# Patient Record
Sex: Female | Born: 1955 | Race: White | Hispanic: No | Smoking: Never smoker
Health system: Southern US, Community
[De-identification: ages and names within clinical notes are randomized; demographics above are authoritative.]

## PROBLEM LIST (undated history)

## (undated) DIAGNOSIS — N189 Chronic kidney disease, unspecified: Secondary | ICD-10-CM

## (undated) DIAGNOSIS — I10 Essential (primary) hypertension: Secondary | ICD-10-CM

## (undated) DIAGNOSIS — F32A Depression, unspecified: Secondary | ICD-10-CM

## (undated) DIAGNOSIS — E785 Hyperlipidemia, unspecified: Secondary | ICD-10-CM

## (undated) HISTORY — PX: AUGMENTATION MAMMAPLASTY: SUR837

## (undated) HISTORY — DX: Hyperlipidemia, unspecified: E78.5

## (undated) HISTORY — DX: Chronic kidney disease, unspecified: N18.9

## (undated) HISTORY — DX: Depression, unspecified: F32.A

## (undated) HISTORY — PX: OTHER SURGICAL HISTORY: SHX169

---

## 1999-01-10 HISTORY — PX: BREAST SURGERY: SHX581

## 1999-09-09 ENCOUNTER — Encounter: Admission: RE | Admit: 1999-09-09 | Discharge: 1999-09-09 | Payer: Self-pay | Admitting: Obstetrics & Gynecology

## 1999-09-09 ENCOUNTER — Encounter: Payer: Self-pay | Admitting: Obstetrics & Gynecology

## 1999-09-19 ENCOUNTER — Encounter: Admission: RE | Admit: 1999-09-19 | Discharge: 1999-09-19 | Payer: Self-pay | Admitting: Obstetrics & Gynecology

## 1999-09-19 ENCOUNTER — Encounter: Payer: Self-pay | Admitting: Obstetrics & Gynecology

## 1999-10-17 ENCOUNTER — Encounter: Payer: Self-pay | Admitting: Obstetrics & Gynecology

## 1999-10-17 ENCOUNTER — Ambulatory Visit (HOSPITAL_COMMUNITY): Admission: RE | Admit: 1999-10-17 | Discharge: 1999-10-17 | Payer: Self-pay | Admitting: Obstetrics & Gynecology

## 2000-06-05 ENCOUNTER — Other Ambulatory Visit: Admission: RE | Admit: 2000-06-05 | Discharge: 2000-06-05 | Payer: Self-pay | Admitting: Obstetrics & Gynecology

## 2001-06-10 ENCOUNTER — Other Ambulatory Visit: Admission: RE | Admit: 2001-06-10 | Discharge: 2001-06-10 | Payer: Self-pay | Admitting: Family Medicine

## 2002-07-17 ENCOUNTER — Other Ambulatory Visit: Admission: RE | Admit: 2002-07-17 | Discharge: 2002-07-17 | Payer: Self-pay | Admitting: Family Medicine

## 2003-07-07 ENCOUNTER — Other Ambulatory Visit: Admission: RE | Admit: 2003-07-07 | Discharge: 2003-07-07 | Payer: Self-pay | Admitting: Family Medicine

## 2004-07-19 ENCOUNTER — Other Ambulatory Visit: Admission: RE | Admit: 2004-07-19 | Discharge: 2004-07-19 | Payer: Self-pay | Admitting: Family Medicine

## 2004-07-19 ENCOUNTER — Ambulatory Visit: Payer: Self-pay | Admitting: Family Medicine

## 2015-05-25 DIAGNOSIS — I10 Essential (primary) hypertension: Secondary | ICD-10-CM | POA: Insufficient documentation

## 2015-08-20 DIAGNOSIS — Z720 Tobacco use: Secondary | ICD-10-CM | POA: Insufficient documentation

## 2016-02-11 ENCOUNTER — Emergency Department (HOSPITAL_COMMUNITY)
Admission: EM | Admit: 2016-02-11 | Discharge: 2016-02-11 | Disposition: A | Discharge status: Home / Self Care | Payer: Self-pay | Attending: Emergency Medicine | Admitting: Emergency Medicine

## 2016-02-11 ENCOUNTER — Encounter (HOSPITAL_COMMUNITY): Payer: Self-pay

## 2016-02-11 ENCOUNTER — Emergency Department (HOSPITAL_COMMUNITY): Payer: Self-pay

## 2016-02-11 DIAGNOSIS — S43111A Subluxation of right acromioclavicular joint, initial encounter: Secondary | ICD-10-CM | POA: Insufficient documentation

## 2016-02-11 DIAGNOSIS — I10 Essential (primary) hypertension: Secondary | ICD-10-CM | POA: Insufficient documentation

## 2016-02-11 DIAGNOSIS — W010XXA Fall on same level from slipping, tripping and stumbling without subsequent striking against object, initial encounter: Secondary | ICD-10-CM | POA: Insufficient documentation

## 2016-02-11 DIAGNOSIS — Y9389 Activity, other specified: Secondary | ICD-10-CM | POA: Insufficient documentation

## 2016-02-11 DIAGNOSIS — Y929 Unspecified place or not applicable: Secondary | ICD-10-CM | POA: Insufficient documentation

## 2016-02-11 DIAGNOSIS — S43101A Unspecified dislocation of right acromioclavicular joint, initial encounter: Secondary | ICD-10-CM

## 2016-02-11 DIAGNOSIS — Y999 Unspecified external cause status: Secondary | ICD-10-CM | POA: Insufficient documentation

## 2016-02-11 HISTORY — DX: Essential (primary) hypertension: I10

## 2016-02-11 MED ORDER — HYDROCODONE-ACETAMINOPHEN 5-325 MG PO TABS
1.0000 | ORAL_TABLET | ORAL | Status: AC
  Administered 2016-02-11: 1 via ORAL
  Filled 2016-02-11: qty 1

## 2016-02-11 MED ORDER — HYDROCODONE-ACETAMINOPHEN 5-325 MG PO TABS: 1.0000 | ORAL_TABLET | Freq: Four times a day (QID) | ORAL | 0 refills | Status: DC | PRN

## 2016-02-11 NOTE — ED Provider Notes (Signed)
[facility]-EMERGENCY DEPT Provider Note   CSN: [id number] Arrival date & time: 02/11/16  [id number]   By signing my name below, I, Bobbie Stackhristopher Reid, attest that this documentation has been prepared under the direction and in the presence of Linwood DibblesJon Keyoni Lapinski, MD. Electronically Signed: Bobbie Stackhristopher Reid, Scribe. 02/11/16. 9:59 AM. History   Chief Complaint Chief Complaint  Patient presents with  . Shoulder Pain    The history is provided by the patient and a relative. No language interpreter was used.    HPI Comments: Brandy Walker is a 61 y.o. female who presents to the Emergency Department complaining of right shoulder pain since 7:30 am this morning. Patient states that she was taking her dogs outside when she tripped and fell. She reports a laceration on her lip. She also believes she may have broken her clavicle. She reports having fractured her right humerus head in the past and had a pin placed at that time. She denies LOC, weakness, and numbness. It is noted that she is up to date with her tetanus shot.  Past Medical History:  Diagnosis Date  . Hypertension     There are no active problems to display for this patient.   Past Surgical History:  Procedure Laterality Date  . shoulder sx     Pins in humerus head  age 61    OB History    No data available       Home Medications    Prior to Admission medications   Medication Sig Start Date End Date Taking? Authorizing Provider  HYDROcodone-acetaminophen (NORCO/VICODIN) 5-325 MG tablet Take 1 tablet by mouth every 6 (six) hours as needed. 02/11/16   Linwood DibblesJon Favor Kreh, MD    Family History No family history on file.  Social History Social History  Substance Use Topics  . Smoking status: Never Smoker  . Smokeless tobacco: Never Used  . Alcohol use 1.8 oz/week    3 Shots of liquor per week     Allergies   Patient has no known allergies.   Review of Systems Review of Systems  All other systems reviewed and are negative.    Physical  Exam Updated Vital Signs BP (!) 132/110 (BP Location: Left Arm)   Pulse 97   Temp 97.7 F (36.5 C) (Oral)   Resp 18   Ht 5\' 3"  (1.6 m)   Wt 63.5 kg   SpO2 99%   BMI 24.80 kg/m   Physical Exam  Constitutional: She appears well-developed and well-nourished. No distress.  HENT:  Head: Normocephalic and atraumatic.  Right Ear: External ear normal.  Left Ear: External ear normal.  Proximally 3 mm laceration oral mucosa at the junction of the gingival and labial mucosa.  Eyes: Conjunctivae are normal. Right eye exhibits no discharge. Left eye exhibits no discharge. No scleral icterus.  Neck: Neck supple. No tracheal deviation present.  Cardiovascular: Normal rate.   Pulmonary/Chest: Effort normal. No stridor. No respiratory distress.  Abdominal: She exhibits no distension.  Musculoskeletal: She exhibits no edema.       Right shoulder: She exhibits decreased range of motion, tenderness, bony tenderness and deformity.  Well healed scar, right shoulder. Unable to lift arm at all.  Neurological: She is alert. Cranial nerve deficit: no gross deficits.  Skin: Skin is warm and dry. No rash noted.  Psychiatric: She has a normal mood and affect.  Nursing note and vitals reviewed.    ED Treatments / Results  DIAGNOSTIC STUDIES: Oxygen Saturation is 99% on RA, normal  by my interpretation.    COORDINATION OF CARE: 9:50 AM Discussed treatment plan with pt at bedside, which includes X-ray of shoulder, and pt agreed to plan.   Radiology Dg Clavicle Right  Result Date: 02/11/2016 CLINICAL DATA:  Tripped and fell this morning. Severe pain and limited range of motion. EXAM: RIGHT CLAVICLE - 2+ VIEWS COMPARISON:  None. FINDINGS: No clavicle fracture is seen. There is complete upward subluxation of the distal clavicle relative to the acromion, age indeterminate. IMPRESSION: Complete upper subluxation of the distal clavicle relative to the acromion. This is age indeterminate. This may be old due to  a relative lack of soft tissue swelling visible at radiography. Is this the area of pain? Electronically Signed   By: Paulina Fusi M.D.   On: 02/11/2016 10:26   Dg Shoulder Right  Result Date: 02/11/2016 CLINICAL DATA:  Tripped and fell.  Pain and limited range of motion. EXAM: RIGHT SHOULDER - 2+ VIEW COMPARISON:  None. FINDINGS: Humeral head is properly located relative to the glenoid. 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 Normal humeral acromial distance. Previous anchors in the glenoid region. Complete upward displacement of the distal clavicle relative to the acromion. PG is indeterminate, but favored to be old due to limited regional soft tissue swelling. Regional ribs appear normal. IMPRESSION: Complete upward subluxation of the distal clavicle relative to the acromion. This is age indeterminate but may be old based on relatively little regional soft tissue swelling. Is this the area of pain? Electronically Signed   By: Paulina Fusi M.D.   On: 02/11/2016 10:25    Procedures Procedures (including critical care time)  Medications Ordered in ED Medications  HYDROcodone-acetaminophen (NORCO/VICODIN) 5-325 MG per tablet 1 tablet (1 tablet Oral Given 02/11/16 0954)     Initial Impression / Assessment and Plan / ED Course  I have reviewed the triage vital signs and the nursing notes.  Pertinent labs & imaging results that were available during my care of the patient were reviewed by me and considered in my medical decision making (see chart for details).   Xrays show a severe ac separation.  No fx or dislocation.  Will place in a sling.  Follow up with orthopedics for further treatment.  Final Clinical Impressions(s) / ED Diagnoses   Final diagnoses:  Separation of right acromioclavicular joint, type 3, initial encounter    New Prescriptions New Prescriptions   HYDROCODONE-ACETAMINOPHEN (NORCO/VICODIN) 5-325 MG TABLET    Take 1 tablet by mouth every 6 (six) hours as needed.   I personally  performed the services described in this documentation, which was scribed in my presence.  The recorded information has been reviewed and is accurate.    Linwood Dibbles, MD 02/11/16 1058

## 2016-02-11 NOTE — Discharge Instructions (Signed)
Use the sling to help support your shoulder, contact an orthopedic doctor to arrange for follow up.  Dr Audrie Liauda's group is in [place].  Dr Hilda LiasKeeling and Dr Romeo AppleHarrison [place]

## 2016-02-11 NOTE — ED Triage Notes (Signed)
Pt fell this morning approximately 0730. Tripped while taking dog out. Complaining of pain right clavicular area. Difficulty moving arm . Also has laceration in lower gum

## 2017-03-31 ENCOUNTER — Emergency Department (HOSPITAL_COMMUNITY)
Admission: EM | Admit: 2017-03-31 | Discharge: 2017-04-01 | Disposition: A | Discharge status: Home / Self Care | Payer: Self-pay | Attending: Emergency Medicine | Admitting: Emergency Medicine

## 2017-03-31 ENCOUNTER — Emergency Department (HOSPITAL_COMMUNITY): Payer: Self-pay

## 2017-03-31 ENCOUNTER — Encounter (HOSPITAL_COMMUNITY): Payer: Self-pay

## 2017-03-31 DIAGNOSIS — S0083XA Contusion of other part of head, initial encounter: Secondary | ICD-10-CM | POA: Insufficient documentation

## 2017-03-31 DIAGNOSIS — I10 Essential (primary) hypertension: Secondary | ICD-10-CM | POA: Insufficient documentation

## 2017-03-31 DIAGNOSIS — Z23 Encounter for immunization: Secondary | ICD-10-CM | POA: Insufficient documentation

## 2017-03-31 DIAGNOSIS — Y9389 Activity, other specified: Secondary | ICD-10-CM | POA: Insufficient documentation

## 2017-03-31 DIAGNOSIS — R4182 Altered mental status, unspecified: Secondary | ICD-10-CM | POA: Insufficient documentation

## 2017-03-31 DIAGNOSIS — Y92481 Parking lot as the place of occurrence of the external cause: Secondary | ICD-10-CM | POA: Insufficient documentation

## 2017-03-31 DIAGNOSIS — W228XXA Striking against or struck by other objects, initial encounter: Secondary | ICD-10-CM | POA: Insufficient documentation

## 2017-03-31 DIAGNOSIS — S0181XA Laceration without foreign body of other part of head, initial encounter: Secondary | ICD-10-CM

## 2017-03-31 DIAGNOSIS — F10920 Alcohol use, unspecified with intoxication, uncomplicated: Secondary | ICD-10-CM | POA: Insufficient documentation

## 2017-03-31 DIAGNOSIS — R0789 Other chest pain: Secondary | ICD-10-CM | POA: Insufficient documentation

## 2017-03-31 DIAGNOSIS — Y999 Unspecified external cause status: Secondary | ICD-10-CM | POA: Insufficient documentation

## 2017-03-31 LAB — COMPREHENSIVE METABOLIC PANEL
ALT: 79 U/L — ABNORMAL HIGH (ref 14–54)
ANION GAP: 12 (ref 5–15)
AST: 95 U/L — AB (ref 15–41)
Albumin: 4 g/dL (ref 3.5–5.0)
Alkaline Phosphatase: 75 U/L (ref 38–126)
BUN: 15 mg/dL (ref 6–20)
CHLORIDE: 107 mmol/L (ref 101–111)
CO2: 21 mmol/L — ABNORMAL LOW (ref 22–32)
Calcium: 9 mg/dL (ref 8.9–10.3)
Creatinine, Ser: 0.98 mg/dL (ref 0.44–1.00)
GFR calc Af Amer: >60 mL/min (ref >60)
Glucose, Bld: 97 mg/dL (ref 65–99)
POTASSIUM: 4.2 mmol/L (ref 3.5–5.1)
Sodium: 140 mmol/L (ref 135–145)
Total Bilirubin: 0.6 mg/dL (ref 0.3–1.2)
Total Protein: 7.1 g/dL (ref 6.5–8.1)

## 2017-03-31 LAB — CBC
HEMATOCRIT: 39.7 % (ref 36.0–46.0)
HEMOGLOBIN: 13.2 g/dL (ref 12.0–15.0)
MCH: 32.4 pg (ref 26.0–34.0)
MCHC: 33.2 g/dL (ref 30.0–36.0)
MCV: 97.3 fL (ref 78.0–100.0)
Platelets: 238 10*3/uL (ref 150–400)
RBC: 4.08 MIL/uL (ref 3.87–5.11)
RDW: 12.7 % (ref 11.5–15.5)
WBC: 10.4 10*3/uL (ref 4.0–10.5)

## 2017-03-31 LAB — CK: CK TOTAL: 100 U/L (ref 38–234)

## 2017-03-31 LAB — I-STAT CG4 LACTIC ACID, ED: Lactic Acid, Venous: 3.7 mmol/L (ref 0.5–1.9)

## 2017-03-31 LAB — ETHANOL: ALCOHOL ETHYL (B): 296 mg/dL — AB (ref <10)

## 2017-03-31 MED ORDER — MIDAZOLAM HCL 2 MG/2ML IJ SOLN
5.0000 mg | Freq: Once | INTRAMUSCULAR | Status: AC
  Administered 2017-03-31: 5 mg via INTRAMUSCULAR
  Filled 2017-03-31: qty 6

## 2017-03-31 MED ORDER — TETANUS-DIPHTH-ACELL PERTUSSIS 5-2.5-18.5 LF-MCG/0.5 IM SUSP
0.5000 mL | Freq: Once | INTRAMUSCULAR | Status: AC
  Administered 2017-03-31: 0.5 mL via INTRAMUSCULAR
  Filled 2017-03-31: qty 0.5

## 2017-03-31 MED ORDER — FENTANYL CITRATE (PF) 100 MCG/2ML IJ SOLN
50.0000 ug | Freq: Once | INTRAMUSCULAR | Status: AC
  Administered 2017-03-31: 50 ug via INTRAVENOUS
  Filled 2017-03-31: qty 2

## 2017-03-31 MED ORDER — SODIUM CHLORIDE 0.9 % IV BOLUS (SEPSIS)
1000.0000 mL | Freq: Once | INTRAVENOUS | Status: AC
  Administered 2017-03-31: 1000 mL via INTRAVENOUS

## 2017-03-31 MED ORDER — LIDOCAINE-EPINEPHRINE (PF) 2 %-1:200000 IJ SOLN
10.0000 mL | Freq: Once | INTRAMUSCULAR | Status: AC
  Administered 2017-03-31: 10 mL
  Filled 2017-03-31: qty 20

## 2017-03-31 MED ORDER — LIDOCAINE-EPINEPHRINE-TETRACAINE (LET) SOLUTION
3.0000 mL | Freq: Once | NASAL | Status: AC
  Administered 2017-03-31: 3 mL via TOPICAL
  Filled 2017-03-31: qty 3

## 2017-03-31 MED ORDER — LIDOCAINE-EPINEPHRINE 1 %-1:100000 IJ SOLN: 10.0000 mL | Freq: Once | INTRAMUSCULAR | Status: DC

## 2017-03-31 NOTE — ED Notes (Signed)
Patient transported to CT 
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
## 2017-03-31 NOTE — ED Notes (Signed)
Pt swearing at hospital staff states she will be suing everyone and that she is refusing medication.  Explained to pt she no longer has the right to refuse medication d/t paperwork Dr. Clarene DukeLittle took out.  Pt insisting "M.F. Handcuffs" be taken off explained to pt the handcuffs cannot come off until she is calmed down and not a danger to herself or others.  Explained to pt that we need to be able to properly assess her injuries.
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
## 2017-03-31 NOTE — ED Notes (Signed)
Writer notified EDP of abnormal I-stat lactic result 

## 2017-04-01 LAB — RAPID URINE DRUG SCREEN, HOSP PERFORMED
Amphetamines: NOT DETECTED
BENZODIAZEPINES: POSITIVE — AB
Barbiturates: NOT DETECTED
Cocaine: NOT DETECTED
OPIATES: NOT DETECTED
Tetrahydrocannabinol: NOT DETECTED

## 2017-04-01 NOTE — Discharge Instructions (Addendum)
Your sutures are absorbable sutures and will not need to be removed. Use cold compresses to the sore areas. Follow up with your doctor as needed.

## 2017-04-01 NOTE — ED Notes (Signed)
Pt discharged from ED; instructions provided; Pt encouraged to return to ED if symptoms worsen and to f/u with PCP; Pt verbalized understanding of all instructions 

## 2017-05-14 ENCOUNTER — Other Ambulatory Visit: Payer: Self-pay | Admitting: Family Medicine

## 2017-05-14 DIAGNOSIS — F1021 Alcohol dependence, in remission: Secondary | ICD-10-CM | POA: Insufficient documentation

## 2017-05-14 DIAGNOSIS — Z1231 Encounter for screening mammogram for malignant neoplasm of breast: Secondary | ICD-10-CM

## 2017-05-14 DIAGNOSIS — F5101 Primary insomnia: Secondary | ICD-10-CM | POA: Insufficient documentation

## 2017-06-05 ENCOUNTER — Ambulatory Visit: Payer: Self-pay

## 2018-10-18 ENCOUNTER — Other Ambulatory Visit: Payer: Self-pay | Admitting: *Deleted

## 2018-10-18 DIAGNOSIS — Z20822 Contact with and (suspected) exposure to covid-19: Secondary | ICD-10-CM

## 2018-10-20 LAB — NOVEL CORONAVIRUS, NAA: SARS-CoV-2, NAA: NOT DETECTED

## 2019-11-24 ENCOUNTER — Encounter: Payer: Self-pay | Admitting: Family Medicine

## 2019-12-02 ENCOUNTER — Encounter: Payer: Self-pay | Admitting: Family Medicine

## 2019-12-02 ENCOUNTER — Ambulatory Visit: Payer: 59 | Admitting: Family Medicine

## 2019-12-02 ENCOUNTER — Other Ambulatory Visit: Payer: Self-pay

## 2019-12-02 VITALS — BP 147/90 | HR 98 | Temp 98.3°F | Ht 63.0 in | Wt 144.2 lb

## 2019-12-02 DIAGNOSIS — F1021 Alcohol dependence, in remission: Secondary | ICD-10-CM | POA: Diagnosis not present

## 2019-12-02 DIAGNOSIS — Z7689 Persons encountering health services in other specified circumstances: Secondary | ICD-10-CM

## 2019-12-02 DIAGNOSIS — I10 Essential (primary) hypertension: Secondary | ICD-10-CM | POA: Diagnosis not present

## 2019-12-02 DIAGNOSIS — Z1231 Encounter for screening mammogram for malignant neoplasm of breast: Secondary | ICD-10-CM | POA: Diagnosis not present

## 2019-12-02 DIAGNOSIS — F325 Major depressive disorder, single episode, in full remission: Secondary | ICD-10-CM

## 2019-12-02 MED ORDER — AMLODIPINE BESYLATE 10 MG PO TABS: 10.0000 mg | ORAL_TABLET | Freq: Every day | ORAL | 3 refills | Status: DC

## 2019-12-02 MED ORDER — VENLAFAXINE HCL ER 150 MG PO CP24: 150.0000 mg | ORAL_CAPSULE | Freq: Every day | ORAL | 3 refills | Status: DC

## 2019-12-02 NOTE — Patient Instructions (Signed)
DASH Eating Plan °DASH stands for "Dietary Approaches to Stop Hypertension." The DASH eating plan is a healthy eating plan that has been shown to reduce high blood pressure (hypertension). It may also reduce your risk for type 2 diabetes, heart disease, and stroke. The DASH eating plan may also help with weight loss. °What are tips for following this plan? ° °General guidelines °· Avoid eating more than 2,300 mg (milligrams) of salt (sodium) a day. If you have hypertension, you may need to reduce your sodium intake to 1,500 mg a day. °· Limit alcohol intake to no more than 1 drink a day for nonpregnant women and 2 drinks a day for men. One drink equals 12 oz of beer, 5 oz of wine, or 1½ oz of hard liquor. °· Work with your health care provider to maintain a healthy body weight or to lose weight. Ask what an ideal weight is for you. °· Get at least 30 minutes of exercise that causes your heart to beat faster (aerobic exercise) most days of the week. Activities may include walking, swimming, or biking. °· Work with your health care provider or diet and nutrition specialist (dietitian) to adjust your eating plan to your individual calorie needs. °Reading food labels ° °· Check food labels for the amount of sodium per serving. Choose foods with less than 5 percent of the Daily Value of sodium. Generally, foods with less than 300 mg of sodium per serving fit into this eating plan. °· To find whole grains, look for the word "whole" as the first word in the ingredient list. °Shopping °· Buy products labeled as "low-sodium" or "no salt added." °· Buy fresh foods. Avoid canned foods and premade or frozen meals. °Cooking °· Avoid adding salt when cooking. Use salt-free seasonings or herbs instead of table salt or sea salt. Check with your health care provider or pharmacist before using salt substitutes. °· Do not fry foods. Cook foods using healthy methods such as baking, boiling, grilling, and broiling instead. °· Cook with  heart-healthy oils, such as olive, canola, soybean, or sunflower oil. °Meal planning °· Eat a balanced diet that includes: °? 5 or more servings of fruits and vegetables each day. At each meal, try to fill half of your plate with fruits and vegetables. °? Up to 6-8 servings of whole grains each day. °? Less than 6 oz of lean meat, poultry, or fish each day. A 3-oz serving of meat is about the same size as a deck of cards. One egg equals 1 oz. °? 2 servings of low-fat dairy each day. °? A serving of nuts, seeds, or beans 5 times each week. °? Heart-healthy fats. Healthy fats called Omega-3 fatty acids are found in foods such as flaxseeds and coldwater fish, like sardines, salmon, and mackerel. °· Limit how much you eat of the following: °? Canned or prepackaged foods. °? Food that is high in trans fat, such as fried foods. °? Food that is high in saturated fat, such as fatty meat. °? Sweets, desserts, sugary drinks, and other foods with added sugar. °? Full-fat dairy products. °· Do not salt foods before eating. °· Try to eat at least 2 vegetarian meals each week. °· Eat more home-cooked food and less restaurant, buffet, and fast food. °· When eating at a restaurant, ask that your food be prepared with less salt or no salt, if possible. °What foods are recommended? °The items listed may not be a complete list. Talk with your dietitian about   what dietary choices are best for you. °Grains °Whole-grain or whole-wheat bread. Whole-grain or whole-wheat pasta. [place] rice. Oatmeal. Quinoa. Bulgur. Whole-grain and low-sodium cereals. Pita bread. Low-fat, low-sodium crackers. Whole-wheat flour tortillas. °Vegetables °Fresh or frozen vegetables (raw, steamed, roasted, or grilled). Low-sodium or reduced-sodium tomato and vegetable juice. Low-sodium or reduced-sodium tomato sauce and tomato paste. Low-sodium or reduced-sodium canned vegetables. °Fruits °All fresh, dried, or frozen fruit. Canned fruit in natural juice (without  added sugar). °Meat and other protein foods °Skinless chicken or turkey. Ground chicken or turkey. Pork with fat trimmed off. Fish and seafood. Egg whites. Dried beans, peas, or lentils. Unsalted nuts, nut butters, and seeds. Unsalted canned beans. Lean cuts of beef with fat trimmed off. Low-sodium, lean deli meat. °Dairy °Low-fat (1%) or fat-free (skim) milk. Fat-free, low-fat, or reduced-fat cheeses. Nonfat, low-sodium ricotta or cottage cheese. Low-fat or nonfat yogurt. Low-fat, low-sodium cheese. °Fats and oils °Soft margarine without trans fats. Vegetable oil. Low-fat, reduced-fat, or light mayonnaise and salad dressings (reduced-sodium). Canola, safflower, olive, soybean, and sunflower oils. Avocado. °Seasoning and other foods °Herbs. Spices. Seasoning mixes without salt. Unsalted popcorn and pretzels. Fat-free sweets. °What foods are not recommended? °The items listed may not be a complete list. Talk with your dietitian about what dietary choices are best for you. °Grains °Baked goods made with fat, such as croissants, muffins, or some breads. Dry pasta or rice meal packs. °Vegetables °Creamed or fried vegetables. Vegetables in a cheese sauce. Regular canned vegetables (not low-sodium or reduced-sodium). Regular canned tomato sauce and paste (not low-sodium or reduced-sodium). Regular tomato and vegetable juice (not low-sodium or reduced-sodium). Pickles. Olives. °Fruits °Canned fruit in a light or heavy syrup. Fried fruit. Fruit in cream or butter sauce. °Meat and other protein foods °Fatty cuts of meat. Ribs. Fried meat. Bacon. Sausage. Bologna and other processed lunch meats. Salami. Fatback. Hotdogs. Bratwurst. Salted nuts and seeds. Canned beans with added salt. Canned or smoked fish. Whole eggs or egg yolks. Chicken or turkey with skin. °Dairy °Whole or 2% milk, cream, and half-and-half. Whole or full-fat cream cheese. Whole-fat or sweetened yogurt. Full-fat cheese. Nondairy creamers. Whipped toppings.  Processed cheese and cheese spreads. °Fats and oils °Butter. Stick margarine. Lard. Shortening. Ghee. Bacon fat. Tropical oils, such as coconut, palm kernel, or palm oil. °Seasoning and other foods °Salted popcorn and pretzels. Onion salt, garlic salt, seasoned salt, table salt, and sea salt. Worcestershire sauce. Tartar sauce. Barbecue sauce. Teriyaki sauce. Soy sauce, including reduced-sodium. Steak sauce. Canned and packaged gravies. Fish sauce. Oyster sauce. Cocktail sauce. Horseradish that you find on the shelf. Ketchup. Mustard. Meat flavorings and tenderizers. Bouillon cubes. Hot sauce and Tabasco sauce. Premade or packaged marinades. Premade or packaged taco seasonings. Relishes. Regular salad dressings. °Where to find more information: °· National Heart, Lung, and Blood Institute: www.nhlbi.nih.gov °· American Heart Association: www.heart.org °Summary °· The DASH eating plan is a healthy eating plan that has been shown to reduce high blood pressure (hypertension). It may also reduce your risk for type 2 diabetes, heart disease, and stroke. °· With the DASH eating plan, you should limit salt (sodium) intake to 2,300 mg a day. If you have hypertension, you may need to reduce your sodium intake to 1,500 mg a day. °· When on the DASH eating plan, aim to eat more fresh fruits and vegetables, whole grains, lean proteins, low-fat dairy, and heart-healthy fats. °· Work with your health care provider or diet and nutrition specialist (dietitian) to adjust your eating plan to your   individual calorie needs. °This information is not intended to replace advice given to you by your health care provider. Make sure you discuss any questions you have with your health care provider. °Document Revised: 12/08/2016 Document Reviewed: 12/20/2015 °Elsevier Patient Education © 2020 [employer] °Hypertension, Adult °High blood pressure (hypertension) is when the force of blood pumping through the arteries is too strong. The  arteries are the blood vessels that carry blood from the heart throughout the body. Hypertension forces the heart to work harder to pump blood and may cause arteries to become narrow or stiff. Untreated or uncontrolled hypertension can cause a heart attack, heart failure, a stroke, kidney disease, and other problems. °A blood pressure reading consists of a higher number over a lower number. Ideally, your blood pressure should be below 120/80. The first ("top") number is called the systolic pressure. It is a measure of the pressure in your arteries as your heart beats. The second ("bottom") number is called the diastolic pressure. It is a measure of the pressure in your arteries as the heart relaxes. °What are the causes? °The exact cause of this condition is not known. There are some conditions that result in or are related to high blood pressure. °What increases the risk? °Some risk factors for high blood pressure are under your control. The following factors may make you more likely to develop this condition: °· Smoking. °· Having type 2 diabetes mellitus, high cholesterol, or both. °· Not getting enough exercise or physical activity. °· Being overweight. °· Having too much fat, sugar, calories, or salt (sodium) in your diet. °· Drinking too much alcohol. °Some risk factors for high blood pressure may be difficult or impossible to change. Some of these factors include: °· Having chronic kidney disease. °· Having a family history of high blood pressure. °· Age. Risk increases with age. °· Race. You may be at higher risk if you are African American. °· Gender. Men are at higher risk than women before age 45. After age 65, women are at higher risk than men. °· Having obstructive sleep apnea. °· Stress. °What are the signs or symptoms? °High blood pressure may not cause symptoms. Very high blood pressure (hypertensive crisis) may cause: °· Headache. °· Anxiety. °· Shortness of breath. °· Nosebleed. °· Nausea and  vomiting. °· Vision changes. °· Severe chest pain. °· Seizures. °How is this diagnosed? °This condition is diagnosed by measuring your blood pressure while you are seated, with your arm resting on a flat surface, your legs uncrossed, and your feet flat on the floor. The cuff of the blood pressure monitor will be placed directly against the skin of your upper arm at the level of your heart. It should be measured at least twice using the same arm. Certain conditions can cause a difference in blood pressure between your right and left arms. °Certain factors can cause blood pressure readings to be lower or higher than normal for a short period of time: °· When your blood pressure is higher when you are in a health care provider's office than when you are at home, this is called white coat hypertension. Most people with this condition do not need medicines. °· When your blood pressure is higher at home than when you are in a health care provider's office, this is called masked hypertension. Most people with this condition may need medicines to control blood pressure. °If you have a high blood pressure reading during one visit or you have normal blood pressure   with other risk factors, you may be asked to: °· Return on a different day to have your blood pressure checked again. °· Monitor your blood pressure at home for 1 week or longer. °If you are diagnosed with hypertension, you may have other blood or imaging tests to help your health care provider understand your overall risk for other conditions. °How is this treated? °This condition is treated by making healthy lifestyle changes, such as eating healthy foods, exercising more, and reducing your alcohol intake. Your health care provider may prescribe medicine if lifestyle changes are not enough to get your blood pressure under control, and if: °· Your systolic blood pressure is above 130. °· Your diastolic blood pressure is above 80. °Your personal target blood  pressure may vary depending on your medical conditions, your age, and other factors. °Follow these instructions at home: °Eating and drinking ° °· Eat a diet that is high in fiber and potassium, and low in sodium, added sugar, and fat. An example eating plan is called the DASH (Dietary Approaches to Stop Hypertension) diet. To eat this way: °? Eat plenty of fresh fruits and vegetables. Try to fill one half of your plate at each meal with fruits and vegetables. °? Eat whole grains, such as whole-wheat pasta, [place] rice, or whole-grain bread. Fill about one fourth of your plate with whole grains. °? Eat or drink low-fat dairy products, such as skim milk or low-fat yogurt. °? Avoid fatty cuts of meat, processed or cured meats, and poultry with skin. Fill about one fourth of your plate with lean proteins, such as fish, chicken without skin, beans, eggs, or tofu. °? Avoid pre-made and processed foods. These tend to be higher in sodium, added sugar, and fat. °· Reduce your daily sodium intake. Most people with hypertension should eat less than 1,500 mg of sodium a day. °· Do not drink alcohol if: °? Your health care provider tells you not to drink. °? You are pregnant, may be pregnant, or are planning to become pregnant. °· If you drink alcohol: °? Limit how much you use to: °§ 0-1 drink a day for women. °§ 0-2 drinks a day for men. °? Be aware of how much alcohol is in your drink. In the [place] one drink equals one 12 oz bottle of beer (355 mL), one 5 oz glass of wine (148 mL), or one 1½ oz glass of hard liquor (44 mL). °Lifestyle ° °· Work with your health care provider to maintain a healthy body weight or to lose weight. Ask what an ideal weight is for you. °· Get at least 30 minutes of exercise most days of the week. Activities may include walking, swimming, or biking. °· Include exercise to strengthen your muscles (resistance exercise), such as Pilates or lifting weights, as part of your weekly exercise routine. Try  to do these types of exercises for 30 minutes at least 3 days a week. °· Do not use any products that contain nicotine or tobacco, such as cigarettes, e-cigarettes, and chewing tobacco. If you need help quitting, ask your health care provider. °· Monitor your blood pressure at home as told by your health care provider. °· Keep all follow-up visits as told by your health care provider. This is important. °Medicines °· Take over-the-counter and prescription medicines only as told by your health care provider. Follow directions carefully. Blood pressure medicines must be taken as prescribed. °· Do not skip doses of blood pressure medicine. Doing this puts you at   risk for problems and can make the medicine less effective. °· Ask your health care provider about side effects or reactions to medicines that you should watch for. °Contact a health care provider if you: °· Think you are having a reaction to a medicine you are taking. °· Have headaches that keep coming back (recurring). °· Feel dizzy. °· Have swelling in your ankles. °· Have trouble with your vision. °Get help right away if you: °· Develop a severe headache or confusion. °· Have unusual weakness or numbness. °· Feel faint. °· Have severe pain in your chest or abdomen. °· Vomit repeatedly. °· Have trouble breathing. °Summary °· Hypertension is when the force of blood pumping through your arteries is too strong. If this condition is not controlled, it may put you at risk for serious complications. °· Your personal target blood pressure may vary depending on your medical conditions, your age, and other factors. For most people, a normal blood pressure is less than 120/80. °· Hypertension is treated with lifestyle changes, medicines, or a combination of both. Lifestyle changes include losing weight, eating a healthy, low-sodium diet, exercising more, and limiting alcohol. °This information is not intended to replace advice given to you by your health care  provider. Make sure you discuss any questions you have with your health care provider. °Document Revised: 09/05/2017 Document Reviewed: 09/05/2017 °Elsevier Patient Education © 2020 Elsevier Inc. ° °

## 2019-12-02 NOTE — Progress Notes (Signed)
New Patient Office Visit  Subjective:  Patient ID: Brandy Walker, female    DOB: 1955-07-17  Age: 64 y.o. MRN: [id number]  CC:  Chief Complaint  Patient presents with  . New Patient (Initial Visit)    HPI Brandy Walker presents to establish care. She has no new concerns today. She needs refills on her medications today. She had a colonoscopy last year with Carol Ada, MD. She will fill out a record request today.   1. Hypertension Complaint with meds - Yes, but she has been out for the last week Current Medications - amlodipine 10 mg Checking BP at home ranging 120/80 when on medication Exercising Regularly - goes to gym about 3x a week, walks at park Watching Salt intake - Yes Pertinent ROS:  Headache - No Fatigue - No Visual Disturbances - No Chest pain - No Dyspnea - No Palpitations - No LE edema- no  They report good compliance with medications and can restate their regimen by memory. No medication side effects.  2. Depression Brandy Walker reports that she had depression when she was going through a divorce a while back. She reports that her symptoms have been well controlled on venlafaxine 150 mg daily.    Office Visit from 12/02/2019 in [place]  PHQ-9 Total Score 0      3. Alcoholism Brandy Walker reports a history of alcoholism that she was treated for 2 years ago following a DUI. She received treatment in a rehab facility. She also attended AA meeting and was treated with Naltrexone for a year. She is not currently undergoing any treatment. She is currently drinking 2 shots of vodka a night. She denies that this has any current negative impact on her life.   Family, social, and smoking history reviewed.    Past Medical History:  Diagnosis Date  . Depression   . Hyperlipidemia   . Hypertension     Past Surgical History:  Procedure Laterality Date  . BREAST SURGERY  2001   breast augmentation  . shoulder sx     Pins in humerus head  age 7     Family History  Problem Relation Age of Onset  . Arthritis Mother   . Hyperlipidemia Mother   . Hypertension Mother   . Stroke Father   . Diabetes Father   . Cancer Sister        breast  . COPD Brother   . Anxiety disorder Brother   . Depression Brother   . ADD / ADHD Daughter   . Alcohol abuse Son   . Heart disease Maternal Grandmother   . Hypertension Maternal Grandmother   . Cancer Paternal Grandmother        colon/uteran/ureter    Social History   Socioeconomic History  . Marital status: Divorced    Spouse name: Not on file  . Number of children: 2  . Years of education: 63  . Highest education level: Associate degree: occupational, Hotel manager, or vocational program  Occupational History  . Occupation: Therapist, sports  Tobacco Use  . Smoking status: Never Smoker  . Smokeless tobacco: Never Used  Vaping Use  . Vaping Use: Never used  Substance and Sexual Activity  . Alcohol use: Yes    Alcohol/week: 14.0 standard drinks    Types: 14 Shots of liquor per week  . Drug use: Not Currently    Types: Marijuana    Comment: last time using marijuana 25 years ago  . Sexual activity: Yes  Birth control/protection: Post-menopausal  Other Topics Concern  . Not on file  Social History Narrative  . Not on file   Social Determinants of Health   Financial Resource Strain:   . Difficulty of Paying Living Expenses: Not on file  Food Insecurity:   . Worried About Programme researcher, broadcasting/film/video in the Last Year: Not on file  . Ran Out of Food in the Last Year: Not on file  Transportation Needs:   . Lack of Transportation (Medical): Not on file  . Lack of Transportation (Non-Medical): Not on file  Physical Activity:   . Days of Exercise per Week: Not on file  . Minutes of Exercise per Session: Not on file  Stress:   . Feeling of Stress : Not on file  Social Connections:   . Frequency of Communication with Friends and Family: Not on file  . Frequency of Social Gatherings with Friends and  Family: Not on file  . Attends Religious Services: Not on file  . Active Member of Clubs or Organizations: Not on file  . Attends Banker Meetings: Not on file  . Marital Status: Not on file  Intimate Partner Violence:   . Fear of Current or Ex-Partner: Not on file  . Emotionally Abused: Not on file  . Physically Abused: Not on file  . Sexually Abused: Not on file    ROS Review of Systems  Negative unless specially indicated above in HPI.  Objective:   Today's Vitals: BP (!) 147/90   Pulse 98   Temp 98.3 F (36.8 C) (Temporal)   Ht 5\' 3"  (1.6 m)   Wt 144 lb 4 oz (65.4 kg)   BMI 25.55 kg/m   Physical Exam Vitals and nursing note reviewed.  Constitutional:      General: She is not in acute distress.    Appearance: Normal appearance. She is not toxic-appearing or diaphoretic.  Eyes:     Extraocular Movements: Extraocular movements intact.     Conjunctiva/sclera: Conjunctivae normal.  Neck:     Vascular: No carotid bruit.  Cardiovascular:     Rate and Rhythm: Normal rate and regular rhythm.     Heart sounds: Normal heart sounds. No murmur heard.   Pulmonary:     Effort: Pulmonary effort is normal. No respiratory distress.     Breath sounds: Normal breath sounds.  Abdominal:     General: Bowel sounds are normal. There is no distension.     Palpations: Abdomen is soft.     Tenderness: There is no abdominal tenderness.  Musculoskeletal:     Cervical back: Neck supple.     Right lower leg: No edema.     Left lower leg: No edema.  Skin:    General: Skin is warm and dry.  Neurological:     General: No focal deficit present.     Mental Status: She is alert and oriented to person, place, and time.     Motor: No weakness.     Gait: Gait normal.  Psychiatric:        Behavior: Behavior normal.     Assessment & Plan:  Ilissa was seen today for new patient (initial visit).  Diagnoses and all orders for this visit:  Primary hypertension Well controlled  on mediation. Restart amlodipine 10 mg. Goal BP <130/80. Agree to notify provider if BP not at goal with medication.  -     amLODipine (NORVASC) 10 MG tablet; Take 1 tablet (10 mg total) by mouth  daily. -     CMP14+EGFR -     CBC with Differential/Platelet -     TSH -     Lipid panel  Depression, major, single episode, complete remission (HCC) Well controlled on current regimen.  -     venlafaxine XR (EFFEXOR-XR) 150 MG 24 hr capsule; Take 1 capsule (150 mg total) by mouth daily.  Recovering alcoholic in remission ([place]) Currently drinking 2 drinks a day. Denies current negative impact on her life. Agrees to notify provider if this changes.   Screening mammogram for breast cancer -     MM 3D SCREEN BREAST BILATERAL; Future  Encounter to establish care Records requested for recent colonoscopy. Patient will schedule appointment for pap.  -     CMP14+EGFR -     CBC with Differential/Platelet -     TSH -     Lipid panel  Follow-up: 3 months for CPE and pap.  The patient indicates understanding of these issues and agrees with the plan.  Gwenlyn Perking, FNP

## 2019-12-03 LAB — CMP14+EGFR
ALT: 59 IU/L — ABNORMAL HIGH (ref 0–32)
AST: 67 IU/L — ABNORMAL HIGH (ref 0–40)
Albumin/Globulin Ratio: 2 (ref 1.2–2.2)
Albumin: 4.9 g/dL — ABNORMAL HIGH (ref 3.8–4.8)
Alkaline Phosphatase: 88 IU/L (ref 44–121)
BUN/Creatinine Ratio: 15 (ref 12–28)
BUN: 17 mg/dL (ref 8–27)
Bilirubin Total: 0.8 mg/dL (ref 0.0–1.2)
CO2: 22 mmol/L (ref 20–29)
Calcium: 10 mg/dL (ref 8.7–10.3)
Chloride: 101 mmol/L (ref 96–106)
Creatinine, Ser: 1.1 mg/dL — ABNORMAL HIGH (ref 0.57–1.00)
GFR calc Af Amer: 62 mL/min/{1.73_m2} (ref >59)
GFR calc non Af Amer: 54 mL/min/{1.73_m2} — ABNORMAL LOW (ref >59)
Globulin, Total: 2.5 g/dL (ref 1.5–4.5)
Glucose: 85 mg/dL (ref 65–99)
Potassium: 5 mmol/L (ref 3.5–5.2)
Sodium: 140 mmol/L (ref 134–144)
Total Protein: 7.4 g/dL (ref 6.0–8.5)

## 2019-12-03 LAB — CBC WITH DIFFERENTIAL/PLATELET
Basophils Absolute: 0.1 10*3/uL (ref 0.0–0.2)
Basos: 2 %
EOS (ABSOLUTE): 0.3 10*3/uL (ref 0.0–0.4)
Eos: 3 %
Hematocrit: 42.1 % (ref 34.0–46.6)
Hemoglobin: 14.5 g/dL (ref 11.1–15.9)
Immature Grans (Abs): 0.1 10*3/uL (ref 0.0–0.1)
Immature Granulocytes: 1 %
Lymphocytes Absolute: 2.1 10*3/uL (ref 0.7–3.1)
Lymphs: 26 %
MCH: 33.1 pg — ABNORMAL HIGH (ref 26.6–33.0)
MCHC: 34.4 g/dL (ref 31.5–35.7)
MCV: 96 fL (ref 79–97)
Monocytes Absolute: 1 10*3/uL — ABNORMAL HIGH (ref 0.1–0.9)
Monocytes: 12 %
Neutrophils Absolute: 4.4 10*3/uL (ref 1.4–7.0)
Neutrophils: 56 %
Platelets: 296 10*3/uL (ref 150–450)
RBC: 4.38 x10E6/uL (ref 3.77–5.28)
RDW: 12 % (ref 11.7–15.4)
WBC: 7.9 10*3/uL (ref 3.4–10.8)

## 2019-12-03 LAB — LIPID PANEL
Chol/HDL Ratio: 2.5 ratio (ref 0.0–4.4)
Cholesterol, Total: 281 mg/dL — ABNORMAL HIGH (ref 100–199)
HDL: 114 mg/dL (ref >39)
LDL Chol Calc (NIH): 149 mg/dL — ABNORMAL HIGH (ref 0–99)
Triglycerides: 110 mg/dL (ref 0–149)
VLDL Cholesterol Cal: 18 mg/dL (ref 5–40)

## 2019-12-03 LAB — TSH: TSH: 2.52 u[IU]/mL (ref 0.450–4.500)

## 2019-12-17 ENCOUNTER — Ambulatory Visit
Admission: RE | Admit: 2019-12-17 | Discharge: 2019-12-17 | Disposition: A | Discharge status: Home / Self Care | Payer: 59 | Source: Ambulatory Visit | Attending: Family Medicine | Admitting: Family Medicine

## 2019-12-17 ENCOUNTER — Other Ambulatory Visit: Payer: Self-pay

## 2019-12-17 DIAGNOSIS — Z1231 Encounter for screening mammogram for malignant neoplasm of breast: Secondary | ICD-10-CM

## 2020-03-03 ENCOUNTER — Other Ambulatory Visit (HOSPITAL_COMMUNITY)
Admission: RE | Admit: 2020-03-03 | Discharge: 2020-03-03 | Disposition: A | Discharge status: Home / Self Care | Payer: 59 | Source: Ambulatory Visit | Attending: Family Medicine | Admitting: Family Medicine

## 2020-03-03 ENCOUNTER — Ambulatory Visit (INDEPENDENT_AMBULATORY_CARE_PROVIDER_SITE_OTHER): Payer: 59 | Admitting: Family Medicine

## 2020-03-03 ENCOUNTER — Encounter: Payer: Self-pay | Admitting: Family Medicine

## 2020-03-03 ENCOUNTER — Other Ambulatory Visit: Payer: Self-pay

## 2020-03-03 VITALS — BP 132/86 | HR 98 | Temp 97.6°F | Ht 63.0 in | Wt 146.0 lb

## 2020-03-03 DIAGNOSIS — Z124 Encounter for screening for malignant neoplasm of cervix: Secondary | ICD-10-CM | POA: Diagnosis present

## 2020-03-03 DIAGNOSIS — Z6825 Body mass index (BMI) 25.0-25.9, adult: Secondary | ICD-10-CM | POA: Diagnosis not present

## 2020-03-03 DIAGNOSIS — Z01419 Encounter for gynecological examination (general) (routine) without abnormal findings: Secondary | ICD-10-CM

## 2020-03-03 DIAGNOSIS — Z01411 Encounter for gynecological examination (general) (routine) with abnormal findings: Secondary | ICD-10-CM

## 2020-03-03 DIAGNOSIS — R110 Nausea: Secondary | ICD-10-CM | POA: Diagnosis not present

## 2020-03-03 MED ORDER — ONDANSETRON HCL 4 MG PO TABS: 4.0000 mg | ORAL_TABLET | Freq: Three times a day (TID) | ORAL | 0 refills | Status: DC | PRN

## 2020-03-03 NOTE — Progress Notes (Signed)
Brandy Walker is a 65 y.o. female presents to office today for annual physical exam with pap examination today.  She reports that she has been doing well overall. She reports that her drinking is stable and is drinking about 2 shots of vodka nightly. She denies a negative impact on her life. She has a mammogram scheduled for next month. She has not had a pap in many years but denies a history of abnormal paps. She would like testing for STDs with her pap. She denies any known exposure or symptoms. She had a colonoscopy in 2019.  Concerns today include: 1. Nausea Kathyann reports a GI bug that started about 1 week ago with nausea, vomiting, and diarrhea. She reports resolution of symptoms now but does still have some lingering nausea. She has been drinking ginger ale for her nausea. She is staying well hydrated.     Past Medical History:  Diagnosis Date  . Depression   . Hyperlipidemia   . Hypertension    Social History   Socioeconomic History  . Marital status: Divorced    Spouse name: Not on file  . Number of children: 2  . Years of education: 2  . Highest education level: Associate degree: occupational, Hotel manager, or vocational program  Occupational History  . Occupation: Therapist, sports  Tobacco Use  . Smoking status: Never Smoker  . Smokeless tobacco: Never Used  Vaping Use  . Vaping Use: Never used  Substance and Sexual Activity  . Alcohol use: Yes    Alcohol/week: 14.0 standard drinks    Types: 14 Shots of liquor per week  . Drug use: Not Currently    Types: Marijuana    Comment: last time using marijuana 25 years ago  . Sexual activity: Yes    Birth control/protection: Post-menopausal  Other Topics Concern  . Not on file  Social History Narrative  . Not on file   Social Determinants of Health   Financial Resource Strain: Not on file  Food Insecurity: Not on file  Transportation Needs: Not on file  Physical Activity: Not on file  Stress: Not on file  Social Connections:  Not on file  Intimate Partner Violence: Not on file   Past Surgical History:  Procedure Laterality Date  . BREAST SURGERY  2001   breast augmentation  . shoulder sx     Pins in humerus head  age 50   Family History  Problem Relation Age of Onset  . Arthritis Mother   . Hyperlipidemia Mother   . Hypertension Mother   . Stroke Father   . Diabetes Father   . Cancer Sister        breast  . Breast cancer Sister   . COPD Brother   . Anxiety disorder Brother   . Depression Brother   . ADD / ADHD Daughter   . Alcohol abuse Son   . Heart disease Maternal Grandmother   . Hypertension Maternal Grandmother   . Cancer Paternal Grandmother        colon/uteran/ureter    Current Outpatient Medications:  .  amLODipine (NORVASC) 10 MG tablet, Take 1 tablet (10 mg total) by mouth daily., Disp: 90 tablet, Rfl: 3 .  Cholecalciferol 50 MCG (2000 UT) CAPS, Take 1 tablet by mouth daily., Disp: , Rfl:  .  Coenzyme Q10 300 MG CAPS, Take 1 capsule by mouth daily., Disp: , Rfl:  .  cyanocobalamin 1000 MCG tablet, Take by mouth., Disp: , Rfl:  .  MAGNESIUM PO, Take  by mouth., Disp: , Rfl:  .  niacin (SLO-NIACIN) 500 MG tablet, Take by mouth., Disp: , Rfl:  .  Resveratrol 250 MG CAPS, Take 1 tablet by mouth daily., Disp: , Rfl:  .  TURMERIC PO, by Misc.(Non-Drug; Combo Route) route., Disp: , Rfl:  .  venlafaxine XR (EFFEXOR-XR) 150 MG 24 hr capsule, Take 1 capsule (150 mg total) by mouth daily., Disp: 90 capsule, Rfl: 3  No Known Allergies   ROS: Review of Systems Pertinent items noted in HPI and remainder of comprehensive ROS otherwise negative.    Physical exam Physical Exam Vitals and nursing note reviewed. Exam conducted with a chaperone present.  Constitutional:      General: She is not in acute distress.    Appearance: Normal appearance. She is not ill-appearing, toxic-appearing or diaphoretic.  HENT:     Head: Normocephalic and atraumatic.     Right Ear: Tympanic membrane, ear canal  and external ear normal.     Left Ear: Tympanic membrane, ear canal and external ear normal.     Nose: Nose normal.     Mouth/Throat:     Mouth: Mucous membranes are dry.     Pharynx: Oropharynx is clear.  Eyes:     Extraocular Movements: Extraocular movements intact.     Conjunctiva/sclera: Conjunctivae normal.     Pupils: Pupils are equal, round, and reactive to light.  Cardiovascular:     Rate and Rhythm: Normal rate and regular rhythm.     Pulses: Normal pulses.     Heart sounds: Normal heart sounds. No murmur heard. No friction rub. No gallop.   Pulmonary:     Effort: Pulmonary effort is normal.     Breath sounds: Normal breath sounds.  Abdominal:     General: Bowel sounds are normal. There is no distension.     Palpations: Abdomen is soft. There is no mass.     Tenderness: There is no abdominal tenderness. There is no guarding.  Genitourinary:    General: Normal vulva.     Exam position: Lithotomy position.     Vagina: Normal.     Cervix: Normal.     Uterus: Normal.      Adnexa: Right adnexa normal and left adnexa normal.  Musculoskeletal:        General: No swelling or tenderness. Normal range of motion.     Cervical back: Normal range of motion and neck supple. No tenderness.  Skin:    General: Skin is warm and dry.     Capillary Refill: Capillary refill takes less than 2 seconds.     Findings: No lesion or rash.  Neurological:     General: No focal deficit present.     Mental Status: She is alert and oriented to person, place, and time.     Cranial Nerves: No cranial nerve deficit.     Motor: No weakness.     Gait: Gait normal.  Psychiatric:        Mood and Affect: Mood normal.        Behavior: Behavior normal.        Thought Content: Thought content normal.        Judgment: Judgment normal.    Assessment/ Plan: Candyce Churn here for annual physical exam.   Emberli was seen today for annual exam.  Diagnoses and all orders for this visit:  Well woman  exam with routine gynecological exam Labs pending as below. She did have some ginger ale about 2  hours ago. Unremarkable exam today.  -     CBC with Differential/Platelet -     CMP14+EGFR -     Lipid panel -     VITAMIN D 25 Hydroxy (Vit-D Deficiency, Fractures)  Cervical cancer screening Pap with STD cytology ordered.  -     Cytology - PAP  BMI 25.0-25.9,adult Labs pending as below.  -     CBC with Differential/Platelet -     CMP14+EGFR -     Lipid panel  Nausea Zofran ordered. Return to office for new or worsening symptoms, or if symptoms persist.  -     ondansetron (ZOFRAN) 4 MG tablet; Take 1 tablet (4 mg total) by mouth every 8 (eight) hours as needed for nausea or vomiting.  Counseled on healthy lifestyle choices, including diet (rich in fruits, vegetables and lean meats and low in salt and simple carbohydrates) and exercise (at least 30 minutes of moderate physical activity daily).  Patient to follow up in 1 year for annual exam. Follow up in 3 months for chronic follow up.   The above assessment and management plan was discussed with the patient. The patient verbalized understanding of and has agreed to the management plan. Patient is aware to call the clinic if symptoms persist or worsen. Patient is aware when to return to the clinic for a follow-up visit. Patient educated on when it is appropriate to go to the emergency department.   Marjorie Smolder, FNP-C [facility] [address] [place], [place] [address] [phone]

## 2020-03-03 NOTE — Patient Instructions (Signed)
 Health Maintenance, Female Adopting a healthy lifestyle and getting preventive care are important in promoting health and wellness. Ask your health care provider about:  The right schedule for you to have regular tests and exams.  Things you can do on your own to prevent diseases and keep yourself healthy. What should I know about diet, weight, and exercise? Eat a healthy diet  Eat a diet that includes plenty of vegetables, fruits, low-fat dairy products, and lean protein.  Do not eat a lot of foods that are high in solid fats, added sugars, or sodium.   Maintain a healthy weight Body mass index (BMI) is used to identify weight problems. It estimates body fat based on height and weight. Your health care provider can help determine your BMI and help you achieve or maintain a healthy weight. Get regular exercise Get regular exercise. This is one of the most important things you can do for your health. Most adults should:  Exercise for at least 150 minutes each week. The exercise should increase your heart rate and make you sweat (moderate-intensity exercise).  Do strengthening exercises at least twice a week. This is in addition to the moderate-intensity exercise.  Spend less time sitting. Even light physical activity can be beneficial. Watch cholesterol and blood lipids Have your blood tested for lipids and cholesterol at 63 years of age, then have this test every 5 years. Have your cholesterol levels checked more often if:  Your lipid or cholesterol levels are high.  You are older than 63 years of age.  You are at high risk for heart disease. What should I know about cancer screening? Depending on your health history and family history, you may need to have cancer screening at various ages. This may include screening for:  Breast cancer.  Cervical cancer.  Colorectal cancer.  Skin cancer.  Lung cancer. What should I know about heart disease, diabetes, and high blood  pressure? Blood pressure and heart disease  High blood pressure causes heart disease and increases the risk of stroke. This is more likely to develop in people who have high blood pressure readings, are of African descent, or are overweight.  Have your blood pressure checked: ? Every 3-5 years if you are 18-39 years of age. ? Every year if you are 40 years old or older. Diabetes Have regular diabetes screenings. This checks your fasting blood sugar level. Have the screening done:  Once every three years after age 40 if you are at a normal weight and have a low risk for diabetes.  More often and at a younger age if you are overweight or have a high risk for diabetes. What should I know about preventing infection? Hepatitis B If you have a higher risk for hepatitis B, you should be screened for this virus. Talk with your health care provider to find out if you are at risk for hepatitis B infection. Hepatitis C Testing is recommended for:  Everyone born from 1945 through 1965.  Anyone with known risk factors for hepatitis C. Sexually transmitted infections (STIs)  Get screened for STIs, including gonorrhea and chlamydia, if: ? You are sexually active and are younger than 63 years of age. ? You are older than 63 years of age and your health care provider tells you that you are at risk for this type of infection. ? Your sexual activity has changed since you were last screened, and you are at increased risk for chlamydia or gonorrhea. Ask your health care   provider if you are at risk.  Ask your health care provider about whether you are at high risk for HIV. Your health care provider may recommend a prescription medicine to help prevent HIV infection. If you choose to take medicine to prevent HIV, you should first get tested for HIV. You should then be tested every 3 months for as long as you are taking the medicine. Pregnancy  If you are about to stop having your period (premenopausal) and  you may become pregnant, seek counseling before you get pregnant.  Take 400 to 800 micrograms (mcg) of folic acid every day if you become pregnant.  Ask for birth control (contraception) if you want to prevent pregnancy. Osteoporosis and menopause Osteoporosis is a disease in which the bones lose minerals and strength with aging. This can result in bone fractures. If you are 65 years old or older, or if you are at risk for osteoporosis and fractures, ask your health care provider if you should:  Be screened for bone loss.  Take a calcium or vitamin D supplement to lower your risk of fractures.  Be given hormone replacement therapy (HRT) to treat symptoms of menopause. Follow these instructions at home: Lifestyle  Do not use any products that contain nicotine or tobacco, such as cigarettes, e-cigarettes, and chewing tobacco. If you need help quitting, ask your health care provider.  Do not use street drugs.  Do not share needles.  Ask your health care provider for help if you need support or information about quitting drugs. Alcohol use  Do not drink alcohol if: ? Your health care provider tells you not to drink. ? You are pregnant, may be pregnant, or are planning to become pregnant.  If you drink alcohol: ? Limit how much you use to 0-1 drink a day. ? Limit intake if you are breastfeeding.  Be aware of how much alcohol is in your drink. In the [place] one drink equals one 12 oz bottle of beer (355 mL), one 5 oz glass of wine (148 mL), or one 1 oz glass of hard liquor (44 mL). General instructions  Schedule regular health, dental, and eye exams.  Stay current with your vaccines.  Tell your health care provider if: ? You often feel depressed. ? You have ever been abused or do not feel safe at home. Summary  Adopting a healthy lifestyle and getting preventive care are important in promoting health and wellness.  Follow your health care provider's instructions about healthy  diet, exercising, and getting tested or screened for diseases.  Follow your health care provider's instructions on monitoring your cholesterol and blood pressure. This information is not intended to replace advice given to you by your health care provider. Make sure you discuss any questions you have with your health care provider. Document Revised: 12/19/2017 Document Reviewed: 12/19/2017 Elsevier Patient Education  2021 Elsevier Inc.     Why follow it? Research shows. . Those who follow the Mediterranean diet have a reduced risk of heart disease  . The diet is associated with a reduced incidence of Parkinson's and Alzheimer's diseases . People following the diet may have longer life expectancies and lower rates of chronic diseases  . The Dietary Guidelines for Americans recommends the Mediterranean diet as an eating plan to promote health and prevent disease  What Is the Mediterranean Diet?  . Healthy eating plan based on typical foods and recipes of Mediterranean-style cooking . The diet is primarily a plant based diet; these foods should   make up a majority of meals   Starches - Plant based foods should make up a majority of meals - They are an important sources of vitamins, minerals, energy, antioxidants, and fiber - Choose whole grains, foods high in fiber and minimally processed items  - Typical grain sources include wheat, oats, barley, corn, Naugle rice, bulgar, farro, millet, polenta, couscous  - Various types of beans include chickpeas, lentils, fava beans, black beans, white beans   Fruits  Veggies - Large quantities of antioxidant rich fruits & veggies; 6 or more servings  - Vegetables can be eaten raw or lightly drizzled with oil and cooked  - Vegetables common to the traditional Mediterranean Diet include: artichokes, arugula, beets, broccoli, brussel sprouts, cabbage, carrots, celery, collard greens, cucumbers, eggplant, kale, leeks, lemons, lettuce, mushrooms, okra, onions,  peas, peppers, potatoes, pumpkin, radishes, rutabaga, shallots, spinach, sweet potatoes, turnips, zucchini - Fruits common to the Mediterranean Diet include: apples, apricots, avocados, cherries, clementines, dates, figs, grapefruits, grapes, melons, nectarines, oranges, peaches, pears, pomegranates, strawberries, tangerines  Fats - Replace butter and margarine with healthy oils, such as olive oil, canola oil, and tahini  - Limit nuts to no more than a handful a day  - Nuts include walnuts, almonds, pecans, pistachios, pine nuts  - Limit or avoid candied, honey roasted or heavily salted nuts - Olives are central to the Mediterranean diet - can be eaten whole or used in a variety of dishes   Meats Protein - Limiting red meat: no more than a few times a month - When eating red meat: choose lean cuts and keep the portion to the size of deck of cards - Eggs: approx. 0 to 4 times a week  - Fish and lean poultry: at least 2 a week  - Healthy protein sources include, chicken, turkey, lean beef, lamb - Increase intake of seafood such as tuna, salmon, trout, mackerel, shrimp, scallops - Avoid or limit high fat processed meats such as sausage and bacon  Dairy - Include moderate amounts of low fat dairy products  - Focus on healthy dairy such as fat free yogurt, skim milk, low or reduced fat cheese - Limit dairy products higher in fat such as whole or 2% milk, cheese, ice cream  Alcohol - Moderate amounts of red wine is ok  - No more than 5 oz daily for women (all ages) and men older than age 65  - No more than 10 oz of wine daily for men younger than 65  Other - Limit sweets and other desserts  - Use herbs and spices instead of salt to flavor foods  - Herbs and spices common to the traditional Mediterranean Diet include: basil, bay leaves, chives, cloves, cumin, fennel, garlic, lavender, marjoram, mint, oregano, parsley, pepper, rosemary, sage, savory, sumac, tarragon, thyme   It's not just a diet,  it's a lifestyle:  . The Mediterranean diet includes lifestyle factors typical of those in the region  . Foods, drinks and meals are best eaten with others and savored . Daily physical activity is important for overall good health . This could be strenuous exercise like running and aerobics . This could also be more leisurely activities such as walking, housework, yard-work, or taking the stairs . Moderation is the key; a balanced and healthy diet accommodates most foods and drinks . Consider portion sizes and frequency of consumption of certain foods   Meal Ideas & Options:  . Breakfast:  o Whole wheat toast or whole wheat English   muffins with peanut butter & hard boiled egg o Steel cut oats topped with apples & cinnamon and skim milk  o Fresh fruit: banana, strawberries, melon, berries, peaches  o Smoothies: strawberries, bananas, greek yogurt, peanut butter o Low fat greek yogurt with blueberries and granola  o Egg white omelet with spinach and mushrooms o Breakfast couscous: whole wheat couscous, apricots, skim milk, cranberries  . Sandwiches:  o Hummus and grilled vegetables (peppers, zucchini, squash) on whole wheat bread   o Grilled chicken on whole wheat pita with lettuce, tomatoes, cucumbers or tzatziki  o Tuna salad on whole wheat bread: tuna salad made with greek yogurt, olives, red peppers, capers, green onions o Garlic rosemary lamb pita: lamb sauted with garlic, rosemary, salt & pepper; add lettuce, cucumber, greek yogurt to pita - flavor with lemon juice and black pepper  . Seafood:  o Mediterranean grilled salmon, seasoned with garlic, basil, parsley, lemon juice and black pepper o Shrimp, lemon, and spinach whole-grain pasta salad made with low fat greek yogurt  o Seared scallops with lemon orzo  o Seared tuna steaks seasoned salt, pepper, coriander topped with tomato mixture of olives, tomatoes, olive oil, minced garlic, parsley, green onions and cappers  . Meats:   o Herbed greek chicken salad with kalamata olives, cucumber, feta  o Red bell peppers stuffed with spinach, bulgur, lean ground beef (or lentils) & topped with feta   o Kebabs: skewers of chicken, tomatoes, onions, zucchini, squash  o Turkey burgers: made with red onions, mint, dill, lemon juice, feta cheese topped with roasted red peppers . Vegetarian o Cucumber salad: cucumbers, artichoke hearts, celery, red onion, feta cheese, tossed in olive oil & lemon juice  o Hummus and whole grain pita points with a greek salad (lettuce, tomato, feta, olives, cucumbers, red onion) o Lentil soup with celery, carrots made with vegetable broth, garlic, salt and pepper  o Tabouli salad: parsley, bulgur, mint, scallions, cucumbers, tomato, radishes, lemon juice, olive oil, salt and pepper.      American Heart Association (AHA) Exercise Recommendation  Being physically active is important to prevent heart disease and stroke, the nation's No. 1and No. 5killers. To improve overall cardiovascular health, we suggest at least 150 minutes per week of moderate exercise or 75 minutes per week of vigorous exercise (or a combination of moderate and vigorous activity). Thirty minutes a day, five times a week is an easy goal to remember. You will also experience benefits even if you divide your time into two or three segments of 10 to 15 minutes per day.  For people who would benefit from lowering their blood pressure or cholesterol, we recommend 40 minutes of aerobic exercise of moderate to vigorous intensity three to four times a week to lower the risk for heart attack and stroke.  Physical activity is anything that makes you move your body and burn calories.  This includes things like climbing stairs or playing sports. Aerobic exercises benefit your heart, and include walking, jogging, swimming or biking. Strength and stretching exercises are best for overall stamina and flexibility.  The simplest, positive change you  can make to effectively improve your heart health is to start walking. It's enjoyable, free, easy, social and great exercise. A walking program is flexible and boasts high success rates because people can stick with it. It's easy for walking to become a regular and satisfying part of life.   For Overall Cardiovascular Health:  At least 30 minutes of moderate-intensity aerobic activity at   least 5 days per week for a total of 150  OR   At least 25 minutes of vigorous aerobic activity at least 3 days per week for a total of 75 minutes; or a combination of moderate- and vigorous-intensity aerobic activity  AND   Moderate- to high-intensity muscle-strengthening activity at least 2 days per week for additional health benefits.  For Lowering Blood Pressure and Cholesterol  An average 40 minutes of moderate- to vigorous-intensity aerobic activity 3 or 4 times per week  What if I can't make it to the time goal? Something is always better than nothing! And everyone has to start somewhere. Even if you've been sedentary for years, today is the day you can begin to make healthy changes in your life. If you don't think you'll make it for 30 or 40 minutes, set a reachable goal for today. You can work up toward your overall goal by increasing your time as you get stronger. Don't let all-or-nothing thinking rob you of doing what you can every day.  Source:http://www.heart.org    

## 2020-03-04 ENCOUNTER — Other Ambulatory Visit: Payer: Self-pay | Admitting: Family Medicine

## 2020-03-04 LAB — CMP14+EGFR
ALT: 53 IU/L — ABNORMAL HIGH (ref 0–32)
AST: 54 IU/L — ABNORMAL HIGH (ref 0–40)
Albumin/Globulin Ratio: 1.7 (ref 1.2–2.2)
Albumin: 4.7 g/dL (ref 3.8–4.8)
Alkaline Phosphatase: 103 IU/L (ref 44–121)
BUN/Creatinine Ratio: 11 — ABNORMAL LOW (ref 12–28)
BUN: 12 mg/dL (ref 8–27)
Bilirubin Total: 0.7 mg/dL (ref 0.0–1.2)
CO2: 22 mmol/L (ref 20–29)
Calcium: 9.8 mg/dL (ref 8.7–10.3)
Chloride: 98 mmol/L (ref 96–106)
Creatinine, Ser: 1.06 mg/dL — ABNORMAL HIGH (ref 0.57–1.00)
GFR calc Af Amer: 65 mL/min/1.73
GFR calc non Af Amer: 56 mL/min/1.73 — ABNORMAL LOW
Globulin, Total: 2.8 g/dL (ref 1.5–4.5)
Glucose: 96 mg/dL (ref 65–99)
Potassium: 4.5 mmol/L (ref 3.5–5.2)
Sodium: 137 mmol/L (ref 134–144)
Total Protein: 7.5 g/dL (ref 6.0–8.5)

## 2020-03-04 LAB — CYTOLOGY - PAP: Diagnosis: NEGATIVE

## 2020-03-04 LAB — CBC WITH DIFFERENTIAL/PLATELET
Basophils Absolute: 0.1 x10E3/uL (ref 0.0–0.2)
Basos: 2 %
EOS (ABSOLUTE): 0.2 x10E3/uL (ref 0.0–0.4)
Eos: 2 %
Hematocrit: 40.5 % (ref 34.0–46.6)
Hemoglobin: 13.6 g/dL (ref 11.1–15.9)
Immature Grans (Abs): 0 x10E3/uL (ref 0.0–0.1)
Immature Granulocytes: 0 %
Lymphocytes Absolute: 2 x10E3/uL (ref 0.7–3.1)
Lymphs: 24 %
MCH: 31.4 pg (ref 26.6–33.0)
MCHC: 33.6 g/dL (ref 31.5–35.7)
MCV: 94 fL (ref 79–97)
Monocytes Absolute: 1.2 x10E3/uL — ABNORMAL HIGH (ref 0.1–0.9)
Monocytes: 15 %
Neutrophils Absolute: 4.7 x10E3/uL (ref 1.4–7.0)
Neutrophils: 57 %
Platelets: 332 x10E3/uL (ref 150–450)
RBC: 4.33 x10E6/uL (ref 3.77–5.28)
RDW: 12.3 % (ref 11.7–15.4)
WBC: 8.2 x10E3/uL (ref 3.4–10.8)

## 2020-03-04 LAB — LIPID PANEL
Chol/HDL Ratio: 2.2 ratio (ref 0.0–4.4)
Cholesterol, Total: 285 mg/dL — ABNORMAL HIGH (ref 100–199)
HDL: 130 mg/dL (ref >39)
LDL Chol Calc (NIH): 142 mg/dL — ABNORMAL HIGH (ref 0–99)
Triglycerides: 84 mg/dL (ref 0–149)
VLDL Cholesterol Cal: 13 mg/dL (ref 5–40)

## 2020-03-04 LAB — VITAMIN D 25 HYDROXY (VIT D DEFICIENCY, FRACTURES): Vit D, 25-Hydroxy: 21.4 ng/mL — ABNORMAL LOW (ref 30.0–100.0)

## 2020-03-04 NOTE — Addendum Note (Signed)
Addended by: Gabriel Earing on: 03/04/2020 08:02 AM   Modules accepted: Orders

## 2020-03-05 ENCOUNTER — Other Ambulatory Visit: Payer: Self-pay | Admitting: Family Medicine

## 2020-03-05 DIAGNOSIS — E559 Vitamin D deficiency, unspecified: Secondary | ICD-10-CM

## 2020-03-05 LAB — SPECIMEN STATUS REPORT

## 2020-03-05 LAB — VITAMIN B12: Vitamin B-12: 1141 pg/mL (ref 232–1245)

## 2020-03-05 MED ORDER — VITAMIN D (ERGOCALCIFEROL) 1.25 MG (50000 UNIT) PO CAPS: 50000.0000 [IU] | ORAL_CAPSULE | ORAL | 0 refills | Status: DC

## 2020-03-05 NOTE — Progress Notes (Signed)
Pt returning call about lab results. 

## 2020-03-05 NOTE — Progress Notes (Signed)
Patient called back for her lab results

## 2020-03-06 LAB — VITAMIN B12: Vitamin B-12: 1132 pg/mL (ref 232–1245)

## 2020-03-06 LAB — SPECIMEN STATUS REPORT

## 2020-04-07 ENCOUNTER — Other Ambulatory Visit: Payer: Self-pay | Admitting: Family Medicine

## 2020-04-07 ENCOUNTER — Other Ambulatory Visit: Payer: Self-pay

## 2020-04-07 ENCOUNTER — Ambulatory Visit
Admission: RE | Admit: 2020-04-07 | Discharge: 2020-04-07 | Disposition: A | Discharge status: Home / Self Care | Payer: 59 | Source: Ambulatory Visit | Attending: Family Medicine | Admitting: Family Medicine

## 2020-04-07 DIAGNOSIS — Z1231 Encounter for screening mammogram for malignant neoplasm of breast: Secondary | ICD-10-CM

## 2020-05-13 ENCOUNTER — Other Ambulatory Visit: Payer: Self-pay | Admitting: Family Medicine

## 2020-05-13 DIAGNOSIS — E559 Vitamin D deficiency, unspecified: Secondary | ICD-10-CM

## 2020-05-28 ENCOUNTER — Encounter: Payer: Self-pay | Admitting: Family Medicine

## 2020-05-28 ENCOUNTER — Ambulatory Visit: Payer: 59 | Admitting: Family Medicine

## 2020-11-26 ENCOUNTER — Encounter: Payer: Self-pay | Admitting: Family Medicine

## 2020-11-26 ENCOUNTER — Other Ambulatory Visit: Payer: Self-pay

## 2020-11-26 ENCOUNTER — Other Ambulatory Visit: Payer: Self-pay | Admitting: Family Medicine

## 2020-11-26 ENCOUNTER — Ambulatory Visit (INDEPENDENT_AMBULATORY_CARE_PROVIDER_SITE_OTHER): Payer: 59 | Admitting: Family Medicine

## 2020-11-26 VITALS — BP 131/84 | HR 104 | Temp 96.5°F | Resp 20 | Ht 63.0 in | Wt 139.0 lb

## 2020-11-26 DIAGNOSIS — Z23 Encounter for immunization: Secondary | ICD-10-CM

## 2020-11-26 DIAGNOSIS — R300 Dysuria: Secondary | ICD-10-CM | POA: Diagnosis not present

## 2020-11-26 DIAGNOSIS — N898 Other specified noninflammatory disorders of vagina: Secondary | ICD-10-CM | POA: Diagnosis not present

## 2020-11-26 DIAGNOSIS — I10 Essential (primary) hypertension: Secondary | ICD-10-CM

## 2020-11-26 DIAGNOSIS — A599 Trichomoniasis, unspecified: Secondary | ICD-10-CM

## 2020-11-26 DIAGNOSIS — F325 Major depressive disorder, single episode, in full remission: Secondary | ICD-10-CM

## 2020-11-26 LAB — MICROSCOPIC EXAMINATION

## 2020-11-26 LAB — URINALYSIS, COMPLETE
Bilirubin, UA: NEGATIVE
Nitrite, UA: NEGATIVE
Specific Gravity, UA: 1.025 (ref 1.005–1.030)
Urobilinogen, Ur: 1 mg/dL (ref 0.2–1.0)
pH, UA: 6 (ref 5.0–7.5)

## 2020-11-26 LAB — WET PREP FOR TRICH, YEAST, CLUE
Clue Cell Exam: NEGATIVE
Trichomonas Exam: POSITIVE — AB
Yeast Exam: NEGATIVE

## 2020-11-26 MED ORDER — METRONIDAZOLE 500 MG PO TABS: 500.0000 mg | ORAL_TABLET | Freq: Two times a day (BID) | ORAL | 0 refills | Status: AC

## 2020-11-26 NOTE — Addendum Note (Signed)
Addended by: Gabriel Earing on: 11/26/2020 04:44 PM   Modules accepted: Orders

## 2020-11-26 NOTE — Progress Notes (Signed)
Acute Office Visit  Subjective:    Patient ID: Brandy Walker, female    DOB: 10-Mar-1955, 65 y.o.   MRN: [id number]  Chief Complaint  Patient presents with   Vaginal Discharge   Vaginal Itching    HPI Patient is in today for vaginal itching, irritation, and discharge x 3 weeks. She has recently had intercourse with a new partner. She tried monistat with mild improvement. She report the discharge is light yellow. She does have some burning with urination but feels like it is related to the irritation. She denies other urinary symptoms.   Past Medical History:  Diagnosis Date   Depression    Hyperlipidemia    Hypertension     Past Surgical History:  Procedure Laterality Date   AUGMENTATION MAMMAPLASTY     BREAST SURGERY  2001   breast augmentation   shoulder sx     Pins in humerus head  age 68    Family History  Problem Relation Age of Onset   Arthritis Mother    Hyperlipidemia Mother    Hypertension Mother    Stroke Father    Diabetes Father    Cancer Sister        breast   Breast cancer Sister    COPD Brother    Anxiety disorder Brother    Depression Brother    ADD / ADHD Daughter    Alcohol abuse Son    Heart disease Maternal Grandmother    Hypertension Maternal Grandmother    Cancer Paternal Grandmother        colon/uteran/ureter    Social History   Socioeconomic History   Marital status: Divorced    Spouse name: Not on file   Number of children: 2   Years of education: 14   Highest education level: Associate degree: occupational, Scientist, product/process development, or vocational program  Occupational History   Occupation: Charity fundraiser  Tobacco Use   Smoking status: Never   Smokeless tobacco: Never  Vaping Use   Vaping Use: Never used  Substance and Sexual Activity   Alcohol use: Yes    Alcohol/week: 14.0 standard drinks    Types: 14 Shots of liquor per week   Drug use: Not Currently    Types: Marijuana    Comment: last time using marijuana 25 years ago   Sexual activity: Yes     Birth control/protection: Post-menopausal  Other Topics Concern   Not on file  Social History Narrative   Not on file   Social Determinants of Health   Financial Resource Strain: Not on file  Food Insecurity: Not on file  Transportation Needs: Not on file  Physical Activity: Not on file  Stress: Not on file  Social Connections: Not on file  Intimate Partner Violence: Not on file    Outpatient Medications Prior to Visit  Medication Sig Dispense Refill   amLODipine (NORVASC) 10 MG tablet Take 1 tablet (10 mg total) by mouth daily. 90 tablet 3   Cholecalciferol 50 MCG (2000 UT) CAPS Take 1 tablet by mouth daily.     Coenzyme Q10 300 MG CAPS Take 1 capsule by mouth daily.     cyanocobalamin 1000 MCG tablet Take by mouth.     MAGNESIUM PO Take by mouth.     niacin (SLO-NIACIN) 500 MG tablet Take by mouth.     ondansetron (ZOFRAN) 4 MG tablet Take 1 tablet (4 mg total) by mouth every 8 (eight) hours as needed for nausea or vomiting. 20 tablet 0  Resveratrol 250 MG CAPS Take 1 tablet by mouth daily.     TURMERIC PO by Misc.(Non-Drug; Combo Route) route.     venlafaxine XR (EFFEXOR-XR) 150 MG 24 hr capsule Take 1 capsule (150 mg total) by mouth daily. 90 capsule 3   Vitamin D, Ergocalciferol, (DRISDOL) 1.25 MG (50000 UNIT) CAPS capsule TAKE 1 CAPSULE (50,000 UNITS TOTAL) BY MOUTH EVERY 7 (SEVEN) DAYS 8 capsule 0   No facility-administered medications prior to visit.    No Known Allergies  Review of Systems As per HPI.     Objective:    Physical Exam Vitals and nursing note reviewed.  Constitutional:      General: She is not in acute distress.    Appearance: She is not ill-appearing, toxic-appearing or diaphoretic.  Pulmonary:     Effort: Pulmonary effort is normal. No respiratory distress.  Musculoskeletal:     Right lower leg: No edema.     Left lower leg: No edema.  Skin:    General: Skin is warm and dry.  Neurological:     General: No focal deficit present.      Mental Status: She is alert and oriented to person, place, and time.  Psychiatric:        Mood and Affect: Mood normal.        Behavior: Behavior normal.    BP 131/84   Pulse (!) 104   Temp (!) 96.5 F (35.8 C) (Temporal)   Resp 20   Ht 5\' 3"  (1.6 m)   Wt 139 lb (63 kg)   BMI 24.62 kg/m  Wt Readings from Last 3 Encounters:  11/26/20 139 lb (63 kg)  03/03/20 146 lb (66.2 kg)  12/02/19 144 lb 4 oz (65.4 kg)   Urine dipstick shows negative for all components, positive for leukocytes, red blood cells, glucose, protein, ketones.   Health Maintenance Due  Topic Date Due   Pneumococcal Vaccine 70-69 Years old (1 - PCV) Never done   HIV Screening  Never done   Hepatitis C Screening  Never done   Zoster Vaccines- Shingrix (1 of 2) Never done   COVID-19 Vaccine (4 - Booster for Moderna series) 02/10/2020    There are no preventive care reminders to display for this patient.   Lab Results  Component Value Date   TSH 2.520 12/02/2019   Lab Results  Component Value Date   WBC 8.2 03/03/2020   HGB 13.6 03/03/2020   HCT 40.5 03/03/2020   MCV 94 03/03/2020   PLT 332 03/03/2020   Lab Results  Component Value Date   NA 137 03/03/2020   K 4.5 03/03/2020   CO2 22 03/03/2020   GLUCOSE 96 03/03/2020   BUN 12 03/03/2020   CREATININE 1.06 (H) 03/03/2020   BILITOT 0.7 03/03/2020   ALKPHOS 103 03/03/2020   AST 54 (H) 03/03/2020   ALT 53 (H) 03/03/2020   PROT 7.5 03/03/2020   ALBUMIN 4.7 03/03/2020   CALCIUM 9.8 03/03/2020   ANIONGAP 12 03/31/2017   Lab Results  Component Value Date   CHOL 285 (H) 03/03/2020   Lab Results  Component Value Date   HDL 130 03/03/2020   Lab Results  Component Value Date   LDLCALC 142 (H) 03/03/2020   Lab Results  Component Value Date   TRIG 84 03/03/2020   Lab Results  Component Value Date   CHOLHDL 2.2 03/03/2020   No results found for: HGBA1C     Assessment & Plan:   Analisa was  seen today for vaginal discharge and vaginal  itching.  Diagnoses and all orders for this visit:  Dysuria Not enough urine available for micro or culture. Discussed that UA dip finding are likely due to trich infection. Discussed to follow up if symptoms do not improve with treatment of trich or if they worsen.  -     Urinalysis, Complete -     Urine Culture -     Urine cytology ancillary only; Future  Vaginal discharge + Trich. She will drop off urine for GC/Chlamydia.  -     WET PREP FOR TRICH, YEAST, CLUE -     Urine cytology ancillary only; Future  Trichomoniasis Flagyl as below. Discussed no sexual contact until completion of treatment and resolution of symptoms. Follow up in 3 weeks for test of cure.  -     metroNIDAZOLE (FLAGYL) 500 MG tablet; Take 1 tablet (500 mg total) by mouth 2 (two) times daily for 7 days.  Need for immunization against influenza Flu vaccine today in office.   Return in about 3 weeks (around 12/17/2020) for recheck.  The patient indicates understanding of these issues and agrees with the plan.  Gabriel Earing, FNP

## 2020-11-26 NOTE — Telephone Encounter (Signed)
30 days given Was seen today for acute Needs chronic follow up with pcp

## 2020-11-26 NOTE — Telephone Encounter (Signed)
Scheduled apt for 12/20/2020

## 2020-11-26 NOTE — Patient Instructions (Signed)

## 2020-11-29 ENCOUNTER — Ambulatory Visit: Payer: 59

## 2020-11-29 LAB — CT NG M GENITALIUM NAA, URINE
Chlamydia trachomatis, NAA: NEGATIVE
Mycoplasma genitalium NAA: NEGATIVE
Neisseria gonorrhoeae, NAA: NEGATIVE

## 2020-12-01 LAB — URINE CULTURE

## 2020-12-10 ENCOUNTER — Other Ambulatory Visit: Payer: Self-pay | Admitting: Family Medicine

## 2020-12-10 DIAGNOSIS — F325 Major depressive disorder, single episode, in full remission: Secondary | ICD-10-CM

## 2020-12-10 DIAGNOSIS — I10 Essential (primary) hypertension: Secondary | ICD-10-CM

## 2020-12-20 ENCOUNTER — Ambulatory Visit: Payer: 59 | Admitting: Family Medicine

## 2020-12-20 ENCOUNTER — Encounter: Payer: Self-pay | Admitting: Family Medicine

## 2021-01-14 ENCOUNTER — Encounter: Payer: Self-pay | Admitting: Family Medicine

## 2021-01-14 ENCOUNTER — Other Ambulatory Visit (HOSPITAL_COMMUNITY)
Admission: RE | Admit: 2021-01-14 | Discharge: 2021-01-14 | Disposition: A | Discharge status: Home / Self Care | Payer: 59 | Source: Ambulatory Visit | Attending: Family Medicine | Admitting: Family Medicine

## 2021-01-14 ENCOUNTER — Ambulatory Visit (INDEPENDENT_AMBULATORY_CARE_PROVIDER_SITE_OTHER): Payer: Self-pay | Admitting: Family Medicine

## 2021-01-14 VITALS — BP 128/76 | HR 89 | Temp 98.5°F | Ht 60.0 in | Wt 134.0 lb

## 2021-01-14 DIAGNOSIS — E559 Vitamin D deficiency, unspecified: Secondary | ICD-10-CM

## 2021-01-14 DIAGNOSIS — I10 Essential (primary) hypertension: Secondary | ICD-10-CM

## 2021-01-14 DIAGNOSIS — Z8619 Personal history of other infectious and parasitic diseases: Secondary | ICD-10-CM

## 2021-01-14 DIAGNOSIS — Z114 Encounter for screening for human immunodeficiency virus [HIV]: Secondary | ICD-10-CM

## 2021-01-14 DIAGNOSIS — F325 Major depressive disorder, single episode, in full remission: Secondary | ICD-10-CM

## 2021-01-14 DIAGNOSIS — R634 Abnormal weight loss: Secondary | ICD-10-CM

## 2021-01-14 DIAGNOSIS — Z1159 Encounter for screening for other viral diseases: Secondary | ICD-10-CM

## 2021-01-14 DIAGNOSIS — E7800 Pure hypercholesterolemia, unspecified: Secondary | ICD-10-CM

## 2021-01-14 MED ORDER — VENLAFAXINE HCL ER 150 MG PO CP24: ORAL_CAPSULE | ORAL | 11 refills | Status: DC

## 2021-01-14 MED ORDER — AMLODIPINE BESYLATE 10 MG PO TABS: 10.0000 mg | ORAL_TABLET | Freq: Every day | ORAL | 11 refills | Status: DC

## 2021-01-14 NOTE — Progress Notes (Signed)
Subjective:  Patient ID: Brandy Walker, female    DOB: 12-Jul-1955, 66 y.o.   MRN: [id number]  Patient Care Team: Gwenlyn Perking, FNP as PCP - General (Family Medicine)   Chief Complaint:  Medication Management (Concerned about weight loss, 12 pounds within last month)   HPI: Brandy Walker is a 66 y.o. female presenting on 01/14/2021 for Medication Management (Concerned about weight loss, 12 pounds within last month) Management of chronic medical conditions  Pt presents today for medication refills. She has longstanding hypertension which is well controlled. She states she just took her blood pressure medications just before arriving to appointment. Reports great control at home. She denies chest pain, leg swelling, shortness of breath, dizziness, palpitations, headaches, confusion, weakness, or visual changes.  Her depression is very well controlled with Effexor. No SI or HI. She has vitamin D deficiency and is taking OTC repletion therapy. She denies arthralgias or myalgias.  At last visit with PCP, pt tested positive for trich and was treated. She has not had follow up testing. Denies new sexual partners or symptoms.  She is concerned about weight loss. States she has lost about 12 lbs without trying over the last 2 months. States she had COVID during Thanksgiving and lost her appetite. States she has not regained her appetite fully and is concerned something underlying is causing this. No other associated symptoms.        Relevant past medical, surgical, family, and social history reviewed and updated as indicated.  Allergies and medications reviewed and updated. Data reviewed: Chart in Epic.   Past Medical History:  Diagnosis Date   Depression    Hyperlipidemia    Hypertension     Past Surgical History:  Procedure Laterality Date   AUGMENTATION MAMMAPLASTY     BREAST SURGERY  2001   breast augmentation   shoulder sx     Pins in humerus head  age 73    Social  History   Socioeconomic History   Marital status: Divorced    Spouse name: Not on file   Number of children: 2   Years of education: 14   Highest education level: Associate degree: occupational, Hotel manager, or vocational program  Occupational History   Occupation: Therapist, sports  Tobacco Use   Smoking status: Never   Smokeless tobacco: Never  Vaping Use   Vaping Use: Never used  Substance and Sexual Activity   Alcohol use: Yes    Alcohol/week: 14.0 standard drinks    Types: 14 Shots of liquor per week   Drug use: Not Currently    Types: Marijuana    Comment: last time using marijuana 25 years ago   Sexual activity: Yes    Birth control/protection: Post-menopausal  Other Topics Concern   Not on file  Social History Narrative   Not on file   Social Determinants of Health   Financial Resource Strain: Not on file  Food Insecurity: Not on file  Transportation Needs: Not on file  Physical Activity: Not on file  Stress: Not on file  Social Connections: Not on file  Intimate Partner Violence: Not on file    Outpatient Encounter Medications as of 01/14/2021  Medication Sig   Cholecalciferol 50 MCG (2000 UT) CAPS Take 1 tablet by mouth daily.   Coenzyme Q10 300 MG CAPS Take 1 capsule by mouth daily.   cyanocobalamin 1000 MCG tablet Take by mouth.   MAGNESIUM PO Take by mouth.   niacin (SLO-NIACIN) 500 MG tablet  Take by mouth.   ondansetron (ZOFRAN) 4 MG tablet Take 1 tablet (4 mg total) by mouth every 8 (eight) hours as needed for nausea or vomiting.   Resveratrol 250 MG CAPS Take 1 tablet by mouth daily.   TURMERIC PO by Misc.(Non-Drug; Combo Route) route.   Vitamin D, Ergocalciferol, (DRISDOL) 1.25 MG (50000 UNIT) CAPS capsule TAKE 1 CAPSULE (50,000 UNITS TOTAL) BY MOUTH EVERY 7 (SEVEN) DAYS   [DISCONTINUED] amLODipine (NORVASC) 10 MG tablet TAKE 1 TABLET BY MOUTH EVERY DAY   [DISCONTINUED] venlafaxine XR (EFFEXOR-XR) 150 MG 24 hr capsule TAKE 1 CAPSULE BY MOUTH EVERY DAY   amLODipine  (NORVASC) 10 MG tablet Take 1 tablet (10 mg total) by mouth daily.   venlafaxine XR (EFFEXOR-XR) 150 MG 24 hr capsule TAKE 1 CAPSULE BY MOUTH EVERY DAY   No facility-administered encounter medications on file as of 01/14/2021.    No Known Allergies  Review of Systems  Constitutional:  Positive for appetite change and unexpected weight change. Negative for activity change, chills, diaphoresis, fatigue and fever.  Eyes:  Negative for photophobia and visual disturbance.  Respiratory:  Negative for cough and shortness of breath.   Cardiovascular:  Negative for chest pain, palpitations and leg swelling.  Gastrointestinal:  Negative for abdominal distention, abdominal pain, anal bleeding, blood in stool, constipation, diarrhea, nausea, rectal pain and vomiting.  Endocrine: Negative for cold intolerance, heat intolerance, polydipsia, polyphagia and polyuria.  Genitourinary:  Negative for decreased urine volume, difficulty urinating, hematuria and vaginal bleeding.  Neurological:  Negative for dizziness, tremors, seizures, syncope, facial asymmetry, speech difficulty, weakness, light-headedness, numbness and headaches.  Psychiatric/Behavioral:  Negative for confusion.   All other systems reviewed and are negative.      Objective:  BP 128/76 Comment: home reading   Pulse 89    Temp 98.5 F (36.9 C)    Ht 5' (1.524 m)    Wt 134 lb (60.8 kg)    SpO2 99%    BMI 26.17 kg/m    Wt Readings from Last 3 Encounters:  01/14/21 134 lb (60.8 kg)  11/26/20 139 lb (63 kg)  03/03/20 146 lb (66.2 kg)    Physical Exam Vitals and nursing note reviewed.  Constitutional:      General: She is not in acute distress.    Appearance: Normal appearance. She is well-developed, well-groomed and normal weight. She is not ill-appearing, toxic-appearing or diaphoretic.  HENT:     Head: Normocephalic and atraumatic.     Jaw: There is normal jaw occlusion.     Right Ear: Hearing normal.     Left Ear: Hearing normal.      Nose: Nose normal.     Mouth/Throat:     Lips: Pink.     Mouth: Mucous membranes are moist.     Pharynx: Oropharynx is clear. Uvula midline.  Eyes:     General: Lids are normal.     Extraocular Movements: Extraocular movements intact.     Conjunctiva/sclera: Conjunctivae normal.     Pupils: Pupils are equal, round, and reactive to light.  Neck:     Thyroid: No thyroid mass, thyromegaly or thyroid tenderness.     Vascular: No carotid bruit or JVD.     Trachea: Trachea and phonation normal.  Cardiovascular:     Rate and Rhythm: Normal rate and regular rhythm.     Chest Wall: PMI is not displaced.     Pulses: Normal pulses.     Heart sounds: Normal heart sounds. No  murmur heard.   No friction rub. No gallop.  Pulmonary:     Effort: Pulmonary effort is normal. No respiratory distress.     Breath sounds: Normal breath sounds. No wheezing.  Abdominal:     General: Bowel sounds are normal. There is no abdominal bruit.     Palpations: Abdomen is soft. There is no hepatomegaly or splenomegaly.  Musculoskeletal:        General: Normal range of motion.     Cervical back: Normal range of motion and neck supple.     Right lower leg: No edema.     Left lower leg: No edema.  Lymphadenopathy:     Cervical: No cervical adenopathy.  Skin:    General: Skin is warm and dry.     Capillary Refill: Capillary refill takes less than 2 seconds.     Coloration: Skin is not cyanotic, jaundiced or pale.     Findings: No rash.  Neurological:     General: No focal deficit present.     Mental Status: She is alert and oriented to person, place, and time.     Sensory: Sensation is intact.     Motor: Motor function is intact.     Coordination: Coordination is intact.     Gait: Gait is intact.     Deep Tendon Reflexes: Reflexes are normal and symmetric.  Psychiatric:        Attention and Perception: Attention and perception normal.        Mood and Affect: Mood and affect normal.        Speech:  Speech normal.        Behavior: Behavior normal. Behavior is cooperative.        Thought Content: Thought content normal.        Cognition and Memory: Cognition and memory normal.        Judgment: Judgment normal.    Results for orders placed or performed in visit on 11/26/20  Urine Culture   Specimen: Urine   UR  Result Value Ref Range   Urine Culture, Routine Final report    Organism ID, Bacteria Comment   WET PREP FOR TRICH, YEAST, CLUE   Specimen: Vaginal Fluid   Vaginal Flui  Result Value Ref Range   Trichomonas Exam Positive (A) Negative   Yeast Exam Negative Negative   Clue Cell Exam Negative Negative  Microscopic Examination   Urine  Result Value Ref Range   WBC, UA CANCELED /hpf   RBC CANCELED /hpf   Epithelial Cells (non renal) CANCELED /hpf   Renal Epithel, UA CANCELED /hpf   Casts CANCELED /lpf   Cast Type CANCELED    Crystals CANCELED    Crystal Type CANCELED    Mucus, UA CANCELED    Bacteria, UA CANCELED    Yeast, UA CANCELED    Trichomonas, UA CANCELED   Urinalysis, Complete  Result Value Ref Range   Specific Gravity, UA 1.025 1.005 - 1.030   pH, UA 6.0 5.0 - 7.5   Color, UA Yellow Yellow   Appearance Ur Cloudy (A) Clear   Leukocytes,UA 3+ (A) Negative   Protein,UA 2+ (A) Negative/Trace   Glucose, UA Trace (A) Negative   Ketones, UA Trace (A) Negative   RBC, UA 2+ (A) Negative   Bilirubin, UA Negative Negative   Urobilinogen, Ur 1.0 0.2 - 1.0 mg/dL   Nitrite, UA Negative Negative   Microscopic Examination See below:   Ct Ng M genitalium NAA, Urine  Result Value  Ref Range   Mycoplasma genitalium NAA Negative Negative   Chlamydia trachomatis, NAA Negative Negative   Neisseria gonorrhoeae, NAA Negative Negative       Pertinent labs & imaging results that were available during my care of the patient were reviewed by me and considered in my medical decision making.  Assessment & Plan:  Brandy Walker was seen today for medication management.  Diagnoses  and all orders for this visit:  Primary hypertension Well controlled on current regimen. DASH diet and exercise encouraged. Labs pending. Follow up in 6 months for reevaluation or sooner if warranted.  -     CBC with Differential/Platelet -     CMP14+EGFR -     Lipid panel -     Thyroid Panel With TSH -     Microalbumin / creatinine urine ratio -     amLODipine (NORVASC) 10 MG tablet; Take 1 tablet (10 mg total) by mouth daily.  Vitamin D deficiency Labs pending, will adjust repletion therapy if warranted.  -     CBC with Differential/Platelet -     VITAMIN D 25 Hydroxy (Vit-D Deficiency, Fractures)  Depression, major, single episode, complete remission ([place]) Doing very well on current regimen, continue.  -     Thyroid Panel With TSH -     venlafaxine XR (EFFEXOR-XR) 150 MG 24 hr capsule; TAKE 1 CAPSULE BY MOUTH EVERY DAY  History of sexually transmitted disease Will rescreen today. Pt asymptomatic and denies new partners.  -     Urine cytology ancillary only -     Hepatitis C antibody -     HIV Antibody (routine testing w rflx)  Encounter for hepatitis C screening test for low risk patient -     Hepatitis C antibody  Screening for HIV (human immunodeficiency virus) -     HIV Antibody (routine testing w rflx)  Weight loss, non-intentional Will obtain below labs to evaluate for potential underlying causes. Pt aware to report any continued weight loss or new symptoms.  -     CBC with Differential/Platelet -     CMP14+EGFR -     Lipid panel -     Thyroid Panel With TSH     Continue all other maintenance medications.  Follow up plan: Return in about 6 months (around 07/14/2021), or if symptoms worsen or fail to improve, for HTN.   Continue healthy lifestyle choices, including diet (rich in fruits, vegetables, and lean proteins, and low in salt and simple carbohydrates) and exercise (at least 30 minutes of moderate physical activity daily).  Educational handout given for  DASH  The above assessment and management plan was discussed with the patient. The patient verbalized understanding of and has agreed to the management plan. Patient is aware to call the clinic if they develop any new symptoms or if symptoms persist or worsen. Patient is aware when to return to the clinic for a follow-up visit. Patient educated on when it is appropriate to go to the emergency department.   Monia Pouch, FNP-C [facility] [phone]

## 2021-01-14 NOTE — Patient Instructions (Signed)

## 2021-01-15 LAB — CBC WITH DIFFERENTIAL/PLATELET
Basophils Absolute: 0.1 10*3/uL (ref 0.0–0.2)
Basos: 2 %
EOS (ABSOLUTE): 0.2 10*3/uL (ref 0.0–0.4)
Eos: 3 %
Hematocrit: 39.7 % (ref 34.0–46.6)
Hemoglobin: 13.7 g/dL (ref 11.1–15.9)
Immature Grans (Abs): 0 10*3/uL (ref 0.0–0.1)
Immature Granulocytes: 0 %
Lymphocytes Absolute: 1.7 10*3/uL (ref 0.7–3.1)
Lymphs: 29 %
MCH: 32.2 pg (ref 26.6–33.0)
MCHC: 34.5 g/dL (ref 31.5–35.7)
MCV: 93 fL (ref 79–97)
Monocytes Absolute: 0.7 10*3/uL (ref 0.1–0.9)
Monocytes: 12 %
Neutrophils Absolute: 3.1 10*3/uL (ref 1.4–7.0)
Neutrophils: 54 %
Platelets: 300 10*3/uL (ref 150–450)
RBC: 4.25 x10E6/uL (ref 3.77–5.28)
RDW: 12.3 % (ref 11.7–15.4)
WBC: 5.8 10*3/uL (ref 3.4–10.8)

## 2021-01-15 LAB — LIPID PANEL
Chol/HDL Ratio: 2.9 ratio (ref 0.0–4.4)
Cholesterol, Total: 272 mg/dL — ABNORMAL HIGH (ref 100–199)
HDL: 93 mg/dL (ref >39)
LDL Chol Calc (NIH): 164 mg/dL — ABNORMAL HIGH (ref 0–99)
Triglycerides: 92 mg/dL (ref 0–149)
VLDL Cholesterol Cal: 15 mg/dL (ref 5–40)

## 2021-01-15 LAB — URINE CYTOLOGY ANCILLARY ONLY
Chlamydia: NEGATIVE
Comment: NEGATIVE
Comment: NEGATIVE
Comment: NORMAL
Neisseria Gonorrhea: NEGATIVE
Trichomonas: NEGATIVE

## 2021-01-15 LAB — MICROALBUMIN / CREATININE URINE RATIO
Creatinine, Urine: 278.4 mg/dL
Microalb/Creat Ratio: 15 mg/g creat (ref 0–29)
Microalbumin, Urine: 43 ug/mL

## 2021-01-15 LAB — THYROID PANEL WITH TSH
Free Thyroxine Index: 1.5 (ref 1.2–4.9)
T3 Uptake Ratio: 26 % (ref 24–39)
T4, Total: 5.9 ug/dL (ref 4.5–12.0)
TSH: 1.35 u[IU]/mL (ref 0.450–4.500)

## 2021-01-15 LAB — CMP14+EGFR
ALT: 28 IU/L (ref 0–32)
AST: 31 IU/L (ref 0–40)
Albumin/Globulin Ratio: 1.8 (ref 1.2–2.2)
Albumin: 4.6 g/dL (ref 3.8–4.8)
Alkaline Phosphatase: 81 IU/L (ref 44–121)
BUN/Creatinine Ratio: 15 (ref 12–28)
BUN: 20 mg/dL (ref 8–27)
Bilirubin Total: 0.3 mg/dL (ref 0.0–1.2)
CO2: 23 mmol/L (ref 20–29)
Calcium: 9.8 mg/dL (ref 8.7–10.3)
Chloride: 104 mmol/L (ref 96–106)
Creatinine, Ser: 1.33 mg/dL — ABNORMAL HIGH (ref 0.57–1.00)
Globulin, Total: 2.6 g/dL (ref 1.5–4.5)
Glucose: 86 mg/dL (ref 70–99)
Potassium: 4.4 mmol/L (ref 3.5–5.2)
Sodium: 141 mmol/L (ref 134–144)
Total Protein: 7.2 g/dL (ref 6.0–8.5)
eGFR: 45 mL/min/{1.73_m2} — ABNORMAL LOW (ref >59)

## 2021-01-15 LAB — VITAMIN D 25 HYDROXY (VIT D DEFICIENCY, FRACTURES): Vit D, 25-Hydroxy: 24 ng/mL — ABNORMAL LOW (ref 30.0–100.0)

## 2021-01-15 LAB — HIV ANTIBODY (ROUTINE TESTING W REFLEX): HIV Screen 4th Generation wRfx: NONREACTIVE

## 2021-01-15 LAB — HEPATITIS C ANTIBODY: Hep C Virus Ab: <0.1 s/co ratio (ref 0.0–0.9)

## 2021-01-18 DIAGNOSIS — E7800 Pure hypercholesterolemia, unspecified: Secondary | ICD-10-CM | POA: Insufficient documentation

## 2021-01-18 DIAGNOSIS — F325 Major depressive disorder, single episode, in full remission: Secondary | ICD-10-CM | POA: Insufficient documentation

## 2021-01-18 MED ORDER — ATORVASTATIN CALCIUM 20 MG PO TABS: 20.0000 mg | ORAL_TABLET | Freq: Every day | ORAL | 3 refills | Status: DC

## 2021-01-18 MED ORDER — LISINOPRIL 10 MG PO TABS: 10.0000 mg | ORAL_TABLET | Freq: Every day | ORAL | 3 refills | Status: DC

## 2021-01-18 NOTE — Addendum Note (Signed)
Addended by: Baruch Gouty on: 01/18/2021 01:04 PM   Modules accepted: Orders

## 2021-10-06 ENCOUNTER — Ambulatory Visit: Payer: Self-pay | Admitting: Family Medicine

## 2021-10-12 ENCOUNTER — Other Ambulatory Visit: Payer: Self-pay | Admitting: Family Medicine

## 2021-10-12 DIAGNOSIS — Z1231 Encounter for screening mammogram for malignant neoplasm of breast: Secondary | ICD-10-CM

## 2021-10-17 ENCOUNTER — Encounter: Payer: Self-pay | Admitting: Family Medicine

## 2021-10-17 ENCOUNTER — Ambulatory Visit
Admission: RE | Admit: 2021-10-17 | Discharge: 2021-10-17 | Disposition: A | Discharge status: Home / Self Care | Payer: 59 | Source: Ambulatory Visit | Attending: Family Medicine | Admitting: Family Medicine

## 2021-10-17 ENCOUNTER — Ambulatory Visit (INDEPENDENT_AMBULATORY_CARE_PROVIDER_SITE_OTHER): Payer: Self-pay | Admitting: Family Medicine

## 2021-10-17 VITALS — BP 124/85 | HR 96 | Temp 97.4°F | Ht 60.0 in | Wt 123.5 lb

## 2021-10-17 DIAGNOSIS — R630 Anorexia: Secondary | ICD-10-CM

## 2021-10-17 DIAGNOSIS — E782 Mixed hyperlipidemia: Secondary | ICD-10-CM

## 2021-10-17 DIAGNOSIS — F325 Major depressive disorder, single episode, in full remission: Secondary | ICD-10-CM

## 2021-10-17 DIAGNOSIS — R634 Abnormal weight loss: Secondary | ICD-10-CM

## 2021-10-17 DIAGNOSIS — F1010 Alcohol abuse, uncomplicated: Secondary | ICD-10-CM

## 2021-10-17 DIAGNOSIS — E559 Vitamin D deficiency, unspecified: Secondary | ICD-10-CM

## 2021-10-17 DIAGNOSIS — Z23 Encounter for immunization: Secondary | ICD-10-CM

## 2021-10-17 DIAGNOSIS — Z1231 Encounter for screening mammogram for malignant neoplasm of breast: Secondary | ICD-10-CM

## 2021-10-17 DIAGNOSIS — I10 Essential (primary) hypertension: Secondary | ICD-10-CM

## 2021-10-17 MED ORDER — VENLAFAXINE HCL ER 150 MG PO CP24: ORAL_CAPSULE | ORAL | 1 refills | Status: DC

## 2021-10-17 MED ORDER — VENLAFAXINE HCL ER 75 MG PO CP24: 75.0000 mg | ORAL_CAPSULE | Freq: Every day | ORAL | 1 refills | Status: DC

## 2021-10-17 NOTE — Progress Notes (Addendum)
Established Patient Office Visit  Subjective   Patient ID: Brandy Walker, female    DOB: October 15, 1955  Age: 66 y.o. MRN: [id number]  Chief Complaint  Patient presents with   Medical Management of Chronic Issues   Hypertension   Hyperlipidemia    HPI Omari is here for a chronic follow up. She has had a difficult month after the unexpected passing of her son. She reports that the month of September was a blur. She takes effexor daily for depression/anxiety and has been on this for years. She reports difficulty dealing with her symptoms for the last month. She reports her alcohol use increased last month but that she had not returned to her baseline.    She continues to have a decreased appetite and unintended weight loss. This has been ongoing for about a year. Initially starting after a Covid infection. She reports that this has worsened since her son's death. She reports that she does not eat enough and only has an appetite if she uses THC. She also primarily eats carbs and vegetables. She typically does not eat until lunch. She will eat a small meal then. She eats a small meal again at dinner some nights and other nights she skips dinner altogether.      10/17/2021   10:41 AM 01/14/2021    9:00 AM 11/26/2020    8:39 AM  Depression screen PHQ 2/9  Decreased Interest 0 0 0  Down, Depressed, Hopeless 0 0 0  PHQ - 2 Score 0 0 0  Altered sleeping 0 0 0  Tired, decreased energy 0 0 0  Change in appetite 0 0 0  Feeling bad or failure about yourself  0 0 0  Trouble concentrating 0 0 0  Moving slowly or fidgety/restless 0 0 0  Suicidal thoughts 0 0 0  PHQ-9 Score 0 0 0  Difficult doing work/chores Not difficult at all Not difficult at all       10/17/2021   10:41 AM 01/14/2021    9:01 AM 11/26/2020    8:39 AM  GAD 7 : Generalized Anxiety Score  Nervous, Anxious, on Edge 0 0 0  Control/stop worrying 0 0 0  Worry too much - different things 0 0 0  Trouble relaxing 0 0 0  Restless 0 0 0   Easily annoyed or irritable 0 0 0  Afraid - awful might happen 0 0 0  Total GAD 7 Score 0 0 0  Anxiety Difficulty Not difficult at all Not difficult at all       Past Medical History:  Diagnosis Date   Depression    Hyperlipidemia    Hypertension       ROS Negative unless specially indicated above in HPI.   Objective:     BP 124/85   Pulse 96   Temp (!) 97.4 F (36.3 C) (Temporal)   Ht 5' (1.524 m)   Wt 123 lb 8 oz (56 kg)   SpO2 97%   BMI 24.12 kg/m  Wt Readings from Last 3 Encounters:  10/17/21 123 lb 8 oz (56 kg)  01/14/21 134 lb (60.8 kg)  11/26/20 139 lb (63 kg)      Physical Exam Vitals and nursing note reviewed.  Constitutional:      General: She is not in acute distress.    Appearance: She is not ill-appearing, toxic-appearing or diaphoretic.  HENT:     Head: Normocephalic and atraumatic.  Cardiovascular:     Rate and Rhythm:  Normal rate and regular rhythm.     Heart sounds: Normal heart sounds. No murmur heard. Pulmonary:     Effort: Pulmonary effort is normal. No respiratory distress.     Breath sounds: Normal breath sounds.  Abdominal:     General: Bowel sounds are normal. There is no distension.     Palpations: Abdomen is soft.     Tenderness: There is no abdominal tenderness.  Musculoskeletal:     Right lower leg: No edema.     Left lower leg: No edema.  Skin:    General: Skin is warm and dry.  Neurological:     General: No focal deficit present.     Mental Status: She is alert and oriented to person, place, and time.  Psychiatric:        Mood and Affect: Mood normal.        Behavior: Behavior normal.        Thought Content: Thought content normal.        Judgment: Judgment normal.      No results found for any visits on 10/17/21.  Last CBC Lab Results  Component Value Date   WBC 5.8 01/14/2021   HGB 13.7 01/14/2021   HCT 39.7 01/14/2021   MCV 93 01/14/2021   MCH 32.2 01/14/2021   RDW 12.3 01/14/2021   PLT 300  20/94/7096   Last metabolic panel Lab Results  Component Value Date   GLUCOSE 86 01/14/2021   NA 141 01/14/2021   K 4.4 01/14/2021   CL 104 01/14/2021   CO2 23 01/14/2021   BUN 20 01/14/2021   CREATININE 1.33 (H) 01/14/2021   EGFR 45 (L) 01/14/2021   CALCIUM 9.8 01/14/2021   PROT 7.2 01/14/2021   ALBUMIN 4.6 01/14/2021   LABGLOB 2.6 01/14/2021   AGRATIO 1.8 01/14/2021   BILITOT 0.3 01/14/2021   ALKPHOS 81 01/14/2021   AST 31 01/14/2021   ALT 28 01/14/2021   ANIONGAP 12 03/31/2017   Last lipids Lab Results  Component Value Date   CHOL 272 (H) 01/14/2021   HDL 93 01/14/2021   LDLCALC 164 (H) 01/14/2021   TRIG 92 01/14/2021   CHOLHDL 2.9 01/14/2021   Last thyroid functions Lab Results  Component Value Date   TSH 1.350 01/14/2021   T4TOTAL 5.9 01/14/2021   Last vitamin D Lab Results  Component Value Date   VD25OH 24.0 (L) 01/14/2021      The 10-year ASCVD risk score (Arnett DK, et al., 2019) is: 5.9%    Assessment & Plan:   Adison was seen today for medical management of chronic issues, hypertension and hyperlipidemia.  Diagnoses and all orders for this visit:  Primary hypertension Well controlled on current regimen.  -     Anemia Profile B -     CMP14+EGFR -     Lipid panel -     TSH  Mixed hyperlipidemia Declined statins. Labs pending.  -     Lipid panel  Vitamin D deficiency On repletion therapy. Labs pending.  -     Vitamin D, 25-hydroxy  Depression, major, single episode, complete remission ([place]) Uncontrolled due to recent life events. Increase effexor to 225 mg daily.  -     venlafaxine XR (EFFEXOR-XR) 150 MG 24 hr capsule; TAKE 1 CAPSULE BY MOUTH EVERY DAY -     venlafaxine XR (EFFEXOR-XR) 75 MG 24 hr capsule; Take 1 capsule (75 mg total) by mouth daily with breakfast.  Alcohol abuse, continuous Stable.   Decreased  appetite Unintended weight loss Due to decreased appetite and poor calorie intake, symptoms worsened with increased in  depression. Will work to improve depression symptoms. Labs pending.  -     Anemia Profile B -     CMP14+EGFR -     TSH  Need for immunization against influenza -     Flu Vaccine QUAD 51mo+IM (Fluarix, Fluzone & Alfiuria Quad PF)  Return in about 6 months (around 04/18/2022) for CPE. Declined sooner follow up, she will let me know if symptoms worsen or do not impove.   The patient indicates understanding of these issues and agrees with the plan.  Gwenlyn Perking, FNP

## 2021-10-17 NOTE — Patient Instructions (Signed)

## 2021-10-18 LAB — LIPID PANEL
Chol/HDL Ratio: 1.9 ratio (ref 0.0–4.4)
Cholesterol, Total: 267 mg/dL — ABNORMAL HIGH (ref 100–199)
HDL: 138 mg/dL (ref >39)
LDL Chol Calc (NIH): 116 mg/dL — ABNORMAL HIGH (ref 0–99)
Triglycerides: 82 mg/dL (ref 0–149)
VLDL Cholesterol Cal: 13 mg/dL (ref 5–40)

## 2021-10-18 LAB — CMP14+EGFR
ALT: 26 IU/L (ref 0–32)
AST: 33 IU/L (ref 0–40)
Albumin/Globulin Ratio: 1.8 (ref 1.2–2.2)
Albumin: 4.8 g/dL (ref 3.9–4.9)
Alkaline Phosphatase: 83 IU/L (ref 44–121)
BUN/Creatinine Ratio: 16 (ref 12–28)
BUN: 20 mg/dL (ref 8–27)
Bilirubin Total: 0.8 mg/dL (ref 0.0–1.2)
CO2: 24 mmol/L (ref 20–29)
Calcium: 10 mg/dL (ref 8.7–10.3)
Chloride: 99 mmol/L (ref 96–106)
Creatinine, Ser: 1.29 mg/dL — ABNORMAL HIGH (ref 0.57–1.00)
Globulin, Total: 2.6 g/dL (ref 1.5–4.5)
Glucose: 88 mg/dL (ref 70–99)
Potassium: 4.3 mmol/L (ref 3.5–5.2)
Sodium: 141 mmol/L (ref 134–144)
Total Protein: 7.4 g/dL (ref 6.0–8.5)
eGFR: 46 mL/min/{1.73_m2} — ABNORMAL LOW (ref >59)

## 2021-10-18 LAB — ANEMIA PROFILE B
Basophils Absolute: 0.1 10*3/uL (ref 0.0–0.2)
Basos: 1 %
EOS (ABSOLUTE): 0.2 10*3/uL (ref 0.0–0.4)
Eos: 2 %
Ferritin: 183 ng/mL — ABNORMAL HIGH (ref 15–150)
Folate: 3.4 ng/mL (ref >3.0)
Hematocrit: 39.5 % (ref 34.0–46.6)
Hemoglobin: 13.8 g/dL (ref 11.1–15.9)
Immature Grans (Abs): 0 10*3/uL (ref 0.0–0.1)
Immature Granulocytes: 0 %
Iron Saturation: 46 % (ref 15–55)
Iron: 136 ug/dL (ref 27–139)
Lymphocytes Absolute: 2 10*3/uL (ref 0.7–3.1)
Lymphs: 19 %
MCH: 32.5 pg (ref 26.6–33.0)
MCHC: 34.9 g/dL (ref 31.5–35.7)
MCV: 93 fL (ref 79–97)
Monocytes Absolute: 1 10*3/uL — ABNORMAL HIGH (ref 0.1–0.9)
Monocytes: 10 %
Neutrophils Absolute: 7.3 10*3/uL — ABNORMAL HIGH (ref 1.4–7.0)
Neutrophils: 68 %
Platelets: 308 10*3/uL (ref 150–450)
RBC: 4.24 x10E6/uL (ref 3.77–5.28)
RDW: 11.8 % (ref 11.7–15.4)
Retic Ct Pct: 1.4 % (ref 0.6–2.6)
Total Iron Binding Capacity: 297 ug/dL (ref 250–450)
UIBC: 161 ug/dL (ref 118–369)
Vitamin B-12: 457 pg/mL (ref 232–1245)
WBC: 10.7 10*3/uL (ref 3.4–10.8)

## 2021-10-18 LAB — VITAMIN D 25 HYDROXY (VIT D DEFICIENCY, FRACTURES): Vit D, 25-Hydroxy: 40.3 ng/mL (ref 30.0–100.0)

## 2021-10-18 LAB — TSH: TSH: 2.74 u[IU]/mL (ref 0.450–4.500)

## 2021-10-19 ENCOUNTER — Other Ambulatory Visit: Payer: Self-pay | Admitting: Family Medicine

## 2021-10-19 ENCOUNTER — Other Ambulatory Visit: Payer: Self-pay

## 2021-10-19 DIAGNOSIS — N1831 Chronic kidney disease, stage 3a: Secondary | ICD-10-CM

## 2021-10-19 DIAGNOSIS — E782 Mixed hyperlipidemia: Secondary | ICD-10-CM

## 2021-10-19 MED ORDER — ROSUVASTATIN CALCIUM 5 MG PO TABS: 5.0000 mg | ORAL_TABLET | Freq: Every day | ORAL | 1 refills | Status: DC

## 2021-10-19 MED ORDER — LISINOPRIL 5 MG PO TABS: 5.0000 mg | ORAL_TABLET | Freq: Every day | ORAL | 1 refills | Status: DC

## 2022-01-12 ENCOUNTER — Ambulatory Visit: Payer: Self-pay | Admitting: Family Medicine

## 2022-01-12 ENCOUNTER — Telehealth: Payer: Self-pay | Admitting: Family Medicine

## 2022-01-12 DIAGNOSIS — I10 Essential (primary) hypertension: Secondary | ICD-10-CM

## 2022-01-12 NOTE — Telephone Encounter (Signed)
Patient calling to let us know that she is unable to take lisinopril due to a reaction and would like to have something else called in.

## 2022-01-13 MED ORDER — HYDROCHLOROTHIAZIDE 12.5 MG PO CAPS: 12.5000 mg | ORAL_CAPSULE | Freq: Every day | ORAL | 3 refills | Status: DC

## 2022-01-13 NOTE — Telephone Encounter (Signed)
Pt aware of provider feedback and voiced understanding. 

## 2022-01-13 NOTE — Telephone Encounter (Signed)
I have sent in HCTZ 12.5 mg daily instead. Have her keep an eye on her BP over the next 2-3 weeks to make sure it is at goal of 130s/80s or less with the change.

## 2022-02-03 ENCOUNTER — Other Ambulatory Visit: Payer: Self-pay | Admitting: Family Medicine

## 2022-02-03 DIAGNOSIS — F325 Major depressive disorder, single episode, in full remission: Secondary | ICD-10-CM

## 2022-02-14 ENCOUNTER — Telehealth: Payer: Self-pay | Admitting: Family Medicine

## 2022-02-14 NOTE — Telephone Encounter (Signed)
Patient will call back to schedule for UTI visit. Patient is concerned about taking HCTZ. She thinks it is barely helping. Still recording B/P in the 150/100 range. Thinks it os making her tired dizzy with headaches.   Asking if there is something else

## 2022-02-15 ENCOUNTER — Other Ambulatory Visit: Payer: Self-pay | Admitting: Family Medicine

## 2022-02-15 DIAGNOSIS — F325 Major depressive disorder, single episode, in full remission: Secondary | ICD-10-CM

## 2022-02-16 NOTE — Telephone Encounter (Signed)
Please have her schedule a follow up appt

## 2022-02-16 NOTE — Telephone Encounter (Signed)
FOLLOW UP SCHEDULED FOR 2/15

## 2022-02-23 ENCOUNTER — Ambulatory Visit: Payer: 59 | Admitting: Family Medicine

## 2022-02-23 ENCOUNTER — Encounter: Payer: Self-pay | Admitting: Family Medicine

## 2022-02-23 VITALS — BP 140/84 | HR 96 | Temp 97.7°F | Ht 60.0 in | Wt 125.0 lb

## 2022-02-23 DIAGNOSIS — F325 Major depressive disorder, single episode, in full remission: Secondary | ICD-10-CM | POA: Diagnosis not present

## 2022-02-23 DIAGNOSIS — R300 Dysuria: Secondary | ICD-10-CM

## 2022-02-23 DIAGNOSIS — L819 Disorder of pigmentation, unspecified: Secondary | ICD-10-CM | POA: Diagnosis not present

## 2022-02-23 DIAGNOSIS — I10 Essential (primary) hypertension: Secondary | ICD-10-CM

## 2022-02-23 MED ORDER — AMLODIPINE BESYLATE 5 MG PO TABS: 5.0000 mg | ORAL_TABLET | Freq: Every day | ORAL | 1 refills | Status: DC

## 2022-02-23 NOTE — Progress Notes (Signed)
Established Patient Office Visit  Subjective   Patient ID: Brandy Walker, female    DOB: [birth year]  Age: 67 y.o. MRN: [id number]  Chief Complaint  Patient presents with   Hypertension   Medical Management of Chronic Issues    HPI HTN Complaint with meds - Yes Current Medications - HCTZ 12.5 mg  Checking BP at home ranging 140-150s/80-90s Pertinent ROS:  Headache - No Fatigue - No Visual Disturbances - No Chest pain - No Dyspnea - No Palpitations - No LE edema - No  She was having some dizziness when she first started HCTZ but this has now resolved.   2. UTI symptoms Dysuria, urgency, and frequency for last few weeks. Symptoms are intermittent. Gradually worsening. She denies vaginal discharge, vomiting, flank pain, fever, or chills. She wonders if this is due to HCTZ.   3. Anxiety/depression Reports that higher dose of effexor has been helpful.   4. Skin lesions She also has some lesions that she would like to have checked by dermatology. One is on her scalp and one on her back. They have gotten larger and are scabbed.      02/23/2022    4:25 PM 10/17/2021   10:41 AM 01/14/2021    9:00 AM  Depression screen PHQ 2/9  Decreased Interest 0 0 0  Down, Depressed, Hopeless 0 0 0  PHQ - 2 Score 0 0 0  Altered sleeping 0 0 0  Tired, decreased energy 0 0 0  Change in appetite 0 0 0  Feeling bad or failure about yourself  0 0 0  Trouble concentrating 0 0 0  Moving slowly or fidgety/restless 0 0 0  Suicidal thoughts 0 0 0  PHQ-9 Score 0 0 0  Difficult doing work/chores Not difficult at all Not difficult at all Not difficult at all      02/23/2022    4:25 PM 10/17/2021   10:41 AM 01/14/2021    9:01 AM 11/26/2020    8:39 AM  GAD 7 : Generalized Anxiety Score  Nervous, Anxious, on Edge 0 0 0 0  Control/stop worrying 0 0 0 0  Worry too much - different things 0 0 0 0  Trouble relaxing 0 0 0 0  Restless 0 0 0 0  Easily annoyed or irritable 0 0 0 0  Afraid - awful might  happen 0 0 0 0  Total GAD 7 Score 0 0 0 0  Anxiety Difficulty Not difficult at all Not difficult at all Not difficult at all      Past Medical History:  Diagnosis Date   Depression    Hyperlipidemia    Hypertension       ROS As per HPI.    Objective:     BP (!) 140/84   Pulse 96   Temp 97.7 F (36.5 C) (Temporal)   Ht 5' (1.524 m)   Wt 125 lb (56.7 kg)   SpO2 97%   BMI 24.41 kg/m  Wt Readings from Last 3 Encounters:  02/23/22 125 lb (56.7 kg)  10/17/21 123 lb 8 oz (56 kg)  01/14/21 134 lb (60.8 kg)      Physical Exam Vitals and nursing note reviewed.  Constitutional:      General: She is not in acute distress.    Appearance: She is not ill-appearing, toxic-appearing or diaphoretic.  Cardiovascular:     Rate and Rhythm: Normal rate and regular rhythm.     Heart sounds: Normal heart sounds. No murmur  heard. Pulmonary:     Effort: Pulmonary effort is normal. No respiratory distress.     Breath sounds: Normal breath sounds. No wheezing, rhonchi or rales.  Abdominal:     General: Bowel sounds are normal. There is no distension.     Palpations: Abdomen is soft.     Tenderness: There is no abdominal tenderness. There is no right CVA tenderness, left CVA tenderness, guarding or rebound.  Skin:    General: Skin is warm and dry.     Comments: Raised pigmented lesion with ulcerated center to right temporal area on right flank area.   Neurological:     General: No focal deficit present.     Mental Status: She is alert and oriented to person, place, and time.  Psychiatric:        Mood and Affect: Mood normal.        Behavior: Behavior normal.        Thought Content: Thought content normal.        Judgment: Judgment normal.      No results found for any visits on 02/23/22.    The ASCVD Risk score (Arnett DK, et al., 2019) failed to calculate for the following reasons:   The valid HDL cholesterol range is 20 to 100 mg/dL    Assessment & Plan:   Brandy Walker was  seen today for hypertension and medical management of chronic issues.  Diagnoses and all orders for this visit:  Primary hypertension Not at goal. Add amlodipine. Continue HCTZ. Will recheck BMP today. Continue monitoring at home. Follow up in 4 weeks for recheck.  -     amLODipine (NORVASC) 5 MG tablet; Take 1 tablet (5 mg total) by mouth daily. -     BMP8+EGFR  Depression, major, single episode, complete remission ([place]) Well controlled with effexor.   Dysuria UA doesn't indicate UTI. Culture pending.  -     Urinalysis, Routine w reflex microscopic -     Urine Culture  Atypical pigmented lesion Referral placed.  -     Ambulatory referral to Dermatology   Return in about 4 weeks (around 03/23/2022) for BP check.  The patient indicates understanding of these issues and agrees with the plan.  Gwenlyn Perking, FNP

## 2022-02-24 ENCOUNTER — Other Ambulatory Visit: Payer: Self-pay

## 2022-02-24 DIAGNOSIS — N289 Disorder of kidney and ureter, unspecified: Secondary | ICD-10-CM

## 2022-02-24 LAB — MICROSCOPIC EXAMINATION
Crystal Type: NONE SEEN
Crystals: NONE SEEN
RBC, Urine: NONE SEEN /hpf (ref 0–2)
Renal Epithel, UA: NONE SEEN /hpf
Yeast, UA: NONE SEEN

## 2022-02-24 LAB — BMP8+EGFR
BUN/Creatinine Ratio: 18 (ref 12–28)
BUN: 34 mg/dL — ABNORMAL HIGH (ref 8–27)
CO2: 26 mmol/L (ref 20–29)
Calcium: 9.8 mg/dL (ref 8.7–10.3)
Chloride: 91 mmol/L — ABNORMAL LOW (ref 96–106)
Creatinine, Ser: 1.9 mg/dL — ABNORMAL HIGH (ref 0.57–1.00)
Glucose: 90 mg/dL (ref 70–99)
Potassium: 3.6 mmol/L (ref 3.5–5.2)
Sodium: 135 mmol/L (ref 134–144)
eGFR: 29 mL/min/{1.73_m2} — ABNORMAL LOW (ref >59)

## 2022-02-24 LAB — URINALYSIS, ROUTINE W REFLEX MICROSCOPIC
Bilirubin, UA: NEGATIVE
Glucose, UA: NEGATIVE
Nitrite, UA: NEGATIVE
Protein,UA: NEGATIVE
RBC, UA: NEGATIVE
Specific Gravity, UA: 1.02 (ref 1.005–1.030)
Urobilinogen, Ur: 1 mg/dL (ref 0.2–1.0)
pH, UA: 5.5 (ref 5.0–7.5)

## 2022-02-27 ENCOUNTER — Other Ambulatory Visit: Payer: Self-pay | Admitting: Family Medicine

## 2022-02-27 DIAGNOSIS — N3000 Acute cystitis without hematuria: Secondary | ICD-10-CM

## 2022-02-27 MED ORDER — CEPHALEXIN 500 MG PO CAPS: 500.0000 mg | ORAL_CAPSULE | Freq: Two times a day (BID) | ORAL | 0 refills | Status: DC

## 2022-03-01 LAB — URINE CULTURE

## 2022-03-06 ENCOUNTER — Other Ambulatory Visit: Payer: 59

## 2022-03-06 DIAGNOSIS — N1831 Chronic kidney disease, stage 3a: Secondary | ICD-10-CM

## 2022-03-06 DIAGNOSIS — N289 Disorder of kidney and ureter, unspecified: Secondary | ICD-10-CM

## 2022-03-06 DIAGNOSIS — E782 Mixed hyperlipidemia: Secondary | ICD-10-CM

## 2022-03-07 LAB — LIPID PANEL
Chol/HDL Ratio: 1.7 ratio (ref 0.0–4.4)
Cholesterol, Total: 199 mg/dL (ref 100–199)
HDL: 116 mg/dL
LDL Chol Calc (NIH): 70 mg/dL (ref 0–99)
Triglycerides: 73 mg/dL (ref 0–149)
VLDL Cholesterol Cal: 13 mg/dL (ref 5–40)

## 2022-03-07 LAB — CMP14+EGFR
ALT: 19 IU/L (ref 0–32)
AST: 29 IU/L (ref 0–40)
Albumin/Globulin Ratio: 2 (ref 1.2–2.2)
Albumin: 4.7 g/dL (ref 3.9–4.9)
Alkaline Phosphatase: 80 IU/L (ref 44–121)
BUN/Creatinine Ratio: 14 (ref 12–28)
BUN: 25 mg/dL (ref 8–27)
Bilirubin Total: 0.9 mg/dL (ref 0.0–1.2)
CO2: 21 mmol/L (ref 20–29)
Calcium: 10.2 mg/dL (ref 8.7–10.3)
Chloride: 99 mmol/L (ref 96–106)
Creatinine, Ser: 1.77 mg/dL — ABNORMAL HIGH (ref 0.57–1.00)
Globulin, Total: 2.3 g/dL (ref 1.5–4.5)
Glucose: 77 mg/dL (ref 70–99)
Potassium: 4.3 mmol/L (ref 3.5–5.2)
Sodium: 140 mmol/L (ref 134–144)
Total Protein: 7 g/dL (ref 6.0–8.5)
eGFR: 32 mL/min/1.73 — ABNORMAL LOW

## 2022-03-08 ENCOUNTER — Other Ambulatory Visit: Payer: Self-pay | Admitting: *Deleted

## 2022-03-08 DIAGNOSIS — N289 Disorder of kidney and ureter, unspecified: Secondary | ICD-10-CM

## 2022-03-09 ENCOUNTER — Other Ambulatory Visit: Payer: Self-pay | Admitting: *Deleted

## 2022-03-09 DIAGNOSIS — R300 Dysuria: Secondary | ICD-10-CM

## 2022-03-30 ENCOUNTER — Ambulatory Visit: Payer: 59 | Admitting: Family Medicine

## 2022-03-30 ENCOUNTER — Encounter: Payer: Self-pay | Admitting: Family Medicine

## 2022-03-30 VITALS — BP 132/86 | HR 112 | Temp 97.8°F | Ht 60.0 in | Wt 122.0 lb

## 2022-03-30 DIAGNOSIS — N3000 Acute cystitis without hematuria: Secondary | ICD-10-CM

## 2022-03-30 DIAGNOSIS — I10 Essential (primary) hypertension: Secondary | ICD-10-CM

## 2022-03-30 DIAGNOSIS — N1832 Chronic kidney disease, stage 3b: Secondary | ICD-10-CM | POA: Diagnosis not present

## 2022-03-30 MED ORDER — AMLODIPINE BESYLATE 10 MG PO TABS: 5.0000 mg | ORAL_TABLET | Freq: Every day | ORAL | 3 refills | Status: DC

## 2022-03-30 NOTE — Progress Notes (Signed)
   Established Patient Office Visit  Subjective   Patient ID: Brandy Walker, female    DOB: 22-Dec-1955  Age: 67 y.o. MRN: [id number]  Chief Complaint  Patient presents with   Hypertension    HPI  HTN Complaint with meds - Yes Current Medications - amlodipine 10 mg. She increased her dosage 4 days ago Checking BP at home ranging 130s/80s with increased dosage Pertinent ROS:  Headache - No Fatigue - No Visual Disturbances - No Chest pain - No Dyspnea - No Palpitations - No LE edema - No   She has an appt with nephrology to establish care next week. She has been avoiding NSAIDs.   Denies urinary symptoms since completing abx for UTI.   Past Medical History:  Diagnosis Date   Depression    Hyperlipidemia    Hypertension       ROS As per HPI.    Objective:     BP 132/86   Pulse (!) 112   Temp 97.8 F (36.6 C) (Temporal)   Ht 5' (1.524 m)   Wt 122 lb (55.3 kg)   SpO2 98%   BMI 23.83 kg/m    Physical Exam Vitals and nursing note reviewed.  Constitutional:      General: She is not in acute distress.    Appearance: Normal appearance. She is not ill-appearing, toxic-appearing or diaphoretic.  Cardiovascular:     Rate and Rhythm: Normal rate and regular rhythm.     Heart sounds: Normal heart sounds. No murmur heard. Pulmonary:     Effort: Pulmonary effort is normal. No respiratory distress.     Breath sounds: Normal breath sounds.  Musculoskeletal:     Right lower leg: No edema.     Left lower leg: No edema.  Skin:    General: Skin is warm and dry.  Neurological:     General: No focal deficit present.     Mental Status: She is alert and oriented to person, place, and time.  Psychiatric:        Mood and Affect: Mood normal.        Behavior: Behavior normal.      No results found for any visits on 03/30/22.    The ASCVD Risk score (Arnett DK, et al., 2019) failed to calculate for the following reasons:   The valid HDL cholesterol range is 20 to  100 mg/dL    Assessment & Plan:   Brandy Walker was seen today for hypertension.  Diagnoses and all orders for this visit:  Primary hypertension Well controlled on current regimen.  -     amLODipine (NORVASC) 10 MG tablet; Take 0.5 tablets (5 mg total) by mouth daily.  Stage 3b chronic kidney disease ([place]) Has appt with nephrology next week to establish.   Acute cystitis without hematuria Resolved.    Return in about 3 months (around 06/30/2022) for chronic follow up.   The patient indicates understanding of these issues and agrees with the plan.  Gwenlyn Perking, FNP

## 2022-04-18 ENCOUNTER — Other Ambulatory Visit (HOSPITAL_COMMUNITY): Payer: Self-pay | Admitting: Nephrology

## 2022-04-18 DIAGNOSIS — N1831 Chronic kidney disease, stage 3a: Secondary | ICD-10-CM

## 2022-04-18 DIAGNOSIS — N170 Acute kidney failure with tubular necrosis: Secondary | ICD-10-CM

## 2022-04-18 DIAGNOSIS — I129 Hypertensive chronic kidney disease with stage 1 through stage 4 chronic kidney disease, or unspecified chronic kidney disease: Secondary | ICD-10-CM

## 2022-04-20 ENCOUNTER — Other Ambulatory Visit: Payer: 59

## 2022-04-23 ENCOUNTER — Other Ambulatory Visit: Payer: Self-pay | Admitting: Family Medicine

## 2022-04-23 DIAGNOSIS — F325 Major depressive disorder, single episode, in full remission: Secondary | ICD-10-CM

## 2022-04-24 ENCOUNTER — Other Ambulatory Visit: Payer: Self-pay | Admitting: Family Medicine

## 2022-04-24 DIAGNOSIS — F325 Major depressive disorder, single episode, in full remission: Secondary | ICD-10-CM

## 2022-04-28 ENCOUNTER — Ambulatory Visit (HOSPITAL_COMMUNITY): Payer: 59 | Attending: Nephrology

## 2022-04-28 ENCOUNTER — Encounter (HOSPITAL_COMMUNITY): Payer: Self-pay

## 2022-05-12 ENCOUNTER — Ambulatory Visit (HOSPITAL_COMMUNITY)
Admission: RE | Admit: 2022-05-12 | Discharge: 2022-05-12 | Disposition: A | Discharge status: Home / Self Care | Payer: Medicare Other | Source: Ambulatory Visit | Attending: Nephrology | Admitting: Nephrology

## 2022-05-12 ENCOUNTER — Other Ambulatory Visit: Payer: Self-pay | Admitting: Family Medicine

## 2022-05-12 DIAGNOSIS — N1831 Chronic kidney disease, stage 3a: Secondary | ICD-10-CM | POA: Diagnosis not present

## 2022-05-12 DIAGNOSIS — I10 Essential (primary) hypertension: Secondary | ICD-10-CM

## 2022-05-12 DIAGNOSIS — N170 Acute kidney failure with tubular necrosis: Secondary | ICD-10-CM | POA: Diagnosis not present

## 2022-05-12 DIAGNOSIS — I129 Hypertensive chronic kidney disease with stage 1 through stage 4 chronic kidney disease, or unspecified chronic kidney disease: Secondary | ICD-10-CM | POA: Diagnosis not present

## 2022-05-12 DIAGNOSIS — N189 Chronic kidney disease, unspecified: Secondary | ICD-10-CM | POA: Diagnosis not present

## 2022-05-20 ENCOUNTER — Other Ambulatory Visit: Payer: Self-pay | Admitting: Family Medicine

## 2022-05-20 DIAGNOSIS — F325 Major depressive disorder, single episode, in full remission: Secondary | ICD-10-CM

## 2022-05-24 ENCOUNTER — Other Ambulatory Visit: Payer: Self-pay | Admitting: Family Medicine

## 2022-05-24 DIAGNOSIS — E782 Mixed hyperlipidemia: Secondary | ICD-10-CM

## 2022-06-15 ENCOUNTER — Encounter: Payer: Self-pay | Admitting: Family Medicine

## 2022-06-15 ENCOUNTER — Ambulatory Visit (INDEPENDENT_AMBULATORY_CARE_PROVIDER_SITE_OTHER): Payer: Medicare Other | Admitting: Family Medicine

## 2022-06-15 VITALS — BP 118/70 | HR 103 | Temp 98.1°F | Ht 61.0 in | Wt 120.0 lb

## 2022-06-15 DIAGNOSIS — Z23 Encounter for immunization: Secondary | ICD-10-CM | POA: Diagnosis not present

## 2022-06-15 DIAGNOSIS — L309 Dermatitis, unspecified: Secondary | ICD-10-CM

## 2022-06-15 MED ORDER — TRIAMCINOLONE ACETONIDE 0.5 % EX OINT
1.0000 | TOPICAL_OINTMENT | Freq: Two times a day (BID) | CUTANEOUS | 2 refills | Status: DC
Start: 2022-06-15 — End: 2023-02-19

## 2022-06-15 MED ORDER — PREDNISONE 10 MG (21) PO TBPK
ORAL_TABLET | ORAL | 0 refills | Status: DC
Start: 2022-06-15 — End: 2022-08-16

## 2022-06-15 NOTE — Progress Notes (Signed)
   Acute Office Visit  Subjective:     Patient ID: Brandy Walker, female    DOB: [birth year], 67 y.o.   MRN: [id number]  Chief Complaint  Patient presents with   Follow-up     DXA DUE eczema     Rash This is a new problem. The current episode started more than 1 month ago. The problem has been gradually worsening since onset. The affected locations include the right lower leg, left lower leg, chest, right arm and left arm (popliteal fossa). The rash is characterized by dryness, itchiness and scaling. She was exposed to nothing. Pertinent negatives include no anorexia, congestion, cough, diarrhea, eye pain, facial edema, fever, nail changes, shortness of breath, sore throat or vomiting. Past treatments include topical steroids. The treatment provided significant relief.   Rash improved significantly with kenalog cream. She has been out of this for the last few weeks and rash has worsened again.   Review of Systems  Constitutional:  Negative for fever.  HENT:  Negative for congestion and sore throat.   Eyes:  Negative for pain.  Respiratory:  Negative for cough and shortness of breath.   Gastrointestinal:  Negative for anorexia, diarrhea and vomiting.  Skin:  Positive for rash. Negative for nail changes.        Objective:    BP 118/70   Pulse (!) 103   Temp 98.1 F (36.7 C) (Oral)   Ht 5\' 1"  (1.549 m)   Wt 120 lb (54.4 kg)   SpO2 98%   BMI 22.67 kg/m    Physical Exam Vitals and nursing note reviewed.  Constitutional:      General: She is not in acute distress.    Appearance: She is not ill-appearing, toxic-appearing or diaphoretic.  Pulmonary:     Effort: Pulmonary effort is normal. No respiratory distress.  Musculoskeletal:     Right lower leg: No edema.     Left lower leg: No edema.  Skin:    General: Skin is warm and dry.     Findings: Rash present. Rash is scaling (bilateral UE and LE, chest). Rash is not crusting, pustular or vesicular.  Neurological:      General: No focal deficit present.     Mental Status: She is alert and oriented to person, place, and time.  Psychiatric:        Mood and Affect: Mood normal.        Behavior: Behavior normal.     No results found for any visits on 06/15/22.      Assessment & Plan:   [name] was seen today for follow-up.  Diagnoses and all orders for this visit:  Eczema, unspecified type Prednisone dosage pack as below. Restart kenalog BID. Return to office for new or worsening symptoms, or if symptoms persist.  -     predniSONE (STERAPRED UNI-PAK 21 TAB) 10 MG (21) TBPK tablet; Use as directed on back of pill pack -     triamcinolone ointment (KENALOG) 0.5 %; Apply 1 Application topically 2 (two) times daily.  Need for vaccination -     Pneumococcal conjugate vaccine 20-valent (Prevnar 20)   Schedule welcome to medicare an DEXA.   The patient indicates understanding of these issues and agrees with the plan.  Gabriel Earing, FNP

## 2022-06-16 ENCOUNTER — Other Ambulatory Visit: Payer: Self-pay | Admitting: Family Medicine

## 2022-06-16 DIAGNOSIS — F325 Major depressive disorder, single episode, in full remission: Secondary | ICD-10-CM

## 2022-06-28 DIAGNOSIS — R634 Abnormal weight loss: Secondary | ICD-10-CM | POA: Diagnosis not present

## 2022-06-28 DIAGNOSIS — Z1211 Encounter for screening for malignant neoplasm of colon: Secondary | ICD-10-CM | POA: Diagnosis not present

## 2022-06-29 ENCOUNTER — Telehealth: Payer: Self-pay

## 2022-06-29 NOTE — Telephone Encounter (Signed)
Called patient to sch dxa scan - patient states that she canceled appt for Monday 07/03/2022 not sure if she needs follow up since seeing Dr. Wolfgang Phoenix; as she has not been able to follow up with him since April 2024, she has been unable contact office for follow up. Please advise if patient needs to follow up with their office before follow up with you     Patient is aware that [name] is out of the office until 07/03/2022

## 2022-07-03 ENCOUNTER — Ambulatory Visit: Payer: 59 | Admitting: Family Medicine

## 2022-07-03 ENCOUNTER — Other Ambulatory Visit: Payer: Medicare Other

## 2022-07-03 NOTE — Telephone Encounter (Signed)
It looks like Butanhi wanted to see her back in 4 weeks from her last visit. If she is unable to et an appt with Butanhi, I would like to see her back to recheck labs around the middle of July.

## 2022-07-04 NOTE — Telephone Encounter (Signed)
Pt returned missed call. Reviewed PCP notes with pt. Pt says she has tried getting a hold of Dr Rudean Curt office but no one will call her back. Pt went ahead and scheduled an appt to see PCP on 07/27/22 to recheck labs.

## 2022-07-04 NOTE — Telephone Encounter (Signed)
CALLED PATIENT, NO ANSWER, LEFT MESSAGE TO RETURN CALL 

## 2022-07-26 ENCOUNTER — Other Ambulatory Visit: Payer: Self-pay | Admitting: Family Medicine

## 2022-07-26 DIAGNOSIS — I10 Essential (primary) hypertension: Secondary | ICD-10-CM

## 2022-07-27 ENCOUNTER — Ambulatory Visit: Payer: Medicare Other | Admitting: Family Medicine

## 2022-08-07 ENCOUNTER — Telehealth: Payer: Self-pay | Admitting: Family Medicine

## 2022-08-07 DIAGNOSIS — I10 Essential (primary) hypertension: Secondary | ICD-10-CM

## 2022-08-07 MED ORDER — AMLODIPINE BESYLATE 10 MG PO TABS: 10.0000 mg | ORAL_TABLET | Freq: Every day | ORAL | 3 refills | Status: DC

## 2022-08-07 NOTE — Addendum Note (Signed)
Addended by: Gabriel Earing on: 08/07/2022 10:57 AM   Modules accepted: Orders

## 2022-08-07 NOTE — Telephone Encounter (Signed)
Please advise and send new script if appropriate

## 2022-08-16 ENCOUNTER — Encounter: Payer: Self-pay | Admitting: Family Medicine

## 2022-08-16 ENCOUNTER — Ambulatory Visit (INDEPENDENT_AMBULATORY_CARE_PROVIDER_SITE_OTHER): Payer: Medicare Other | Admitting: Family Medicine

## 2022-08-16 VITALS — BP 128/82 | HR 92 | Temp 97.5°F | Ht 61.0 in | Wt 122.0 lb

## 2022-08-16 DIAGNOSIS — B001 Herpesviral vesicular dermatitis: Secondary | ICD-10-CM

## 2022-08-16 DIAGNOSIS — I10 Essential (primary) hypertension: Secondary | ICD-10-CM

## 2022-08-16 DIAGNOSIS — E782 Mixed hyperlipidemia: Secondary | ICD-10-CM

## 2022-08-16 DIAGNOSIS — N1832 Chronic kidney disease, stage 3b: Secondary | ICD-10-CM

## 2022-08-16 DIAGNOSIS — E559 Vitamin D deficiency, unspecified: Secondary | ICD-10-CM

## 2022-08-16 DIAGNOSIS — F325 Major depressive disorder, single episode, in full remission: Secondary | ICD-10-CM

## 2022-08-16 MED ORDER — VENLAFAXINE HCL ER 150 MG PO CP24
ORAL_CAPSULE | ORAL | 3 refills | Status: DC
Start: 2022-08-16 — End: 2023-10-15

## 2022-08-16 MED ORDER — VENLAFAXINE HCL ER 75 MG PO CP24
75.0000 mg | ORAL_CAPSULE | Freq: Every day | ORAL | 3 refills | Status: DC
Start: 2022-08-16 — End: 2023-07-16

## 2022-08-16 MED ORDER — VALACYCLOVIR HCL 1 G PO TABS
ORAL_TABLET | ORAL | 1 refills | Status: AC
Start: 2022-08-16 — End: ?

## 2022-08-16 NOTE — Progress Notes (Signed)
Established Patient Office Visit  Subjective   Patient ID: Brandy Walker, female    DOB: 1955/07/04  Age: 67 y.o. MRN: [id number]  Chief Complaint  Patient presents with   Medical Management of Chronic Issues   Hypertension    HPI  HTN Complaint with meds - Yes Current Medications - amlodipine 10 mg  Pertinent ROS:  Headache - No Fatigue - No Visual Disturbances - No Chest pain - No Dyspnea - No Palpitations - No LE edema - No  2. CKD Established with neurology, but has not had recent follow up.  3. Vitamin D On daily supplement 2000 international units.   4. Depression Controlled with effexor.   5. Fever blister Hx of fever blisters. Would like refill on valtrex to have on hand.      08/16/2022    2:49 PM 03/30/2022    9:43 AM 02/23/2022    4:25 PM  Depression screen PHQ 2/9  Decreased Interest 0 0 0  Down, Depressed, Hopeless 0 0 0  PHQ - 2 Score 0 0 0  Altered sleeping 0 0 0  Tired, decreased energy 0 0 0  Change in appetite 0 0 0  Feeling bad or failure about yourself  0 0 0  Trouble concentrating 0 0 0  Moving slowly or fidgety/restless 0 0 0  Suicidal thoughts 0 0 0  PHQ-9 Score 0 0 0  Difficult doing work/chores  Not difficult at all Not difficult at all      08/16/2022    2:50 PM 03/30/2022    9:43 AM 02/23/2022    4:25 PM 10/17/2021   10:41 AM  GAD 7 : Generalized Anxiety Score  Nervous, Anxious, on Edge 0 0 0 0  Control/stop worrying 0 0 0 0  Worry too much - different things 0 0 0 0  Trouble relaxing 0 0 0 0  Restless 0 0 0 0  Easily annoyed or irritable 0 0 0 0  Afraid - awful might happen 0 0 0 0  Total GAD 7 Score 0 0 0 0  Anxiety Difficulty  Not difficult at all Not difficult at all Not difficult at all     Past Medical History:  Diagnosis Date   Depression    Hyperlipidemia    Hypertension       ROS As per HPI.    Objective:     BP 128/82   Pulse 92   Temp (!) 97.5 F (36.4 C) (Temporal)   Ht 5\' 1"  (1.549 m)   Wt  122 lb (55.3 kg)   SpO2 99%   BMI 23.05 kg/m    Physical Exam Vitals and nursing note reviewed.  Constitutional:      General: She is not in acute distress.    Appearance: Normal appearance. She is not ill-appearing, toxic-appearing or diaphoretic.  Cardiovascular:     Rate and Rhythm: Normal rate and regular rhythm.     Heart sounds: Normal heart sounds. No murmur heard. Pulmonary:     Effort: Pulmonary effort is normal. No respiratory distress.     Breath sounds: Normal breath sounds.  Musculoskeletal:     Right lower leg: No edema.     Left lower leg: No edema.  Skin:    General: Skin is warm and dry.  Neurological:     General: No focal deficit present.     Mental Status: She is alert and oriented to person, place, and time.  Psychiatric:  Mood and Affect: Mood normal.        Behavior: Behavior normal.      No results found for any visits on 08/16/22.    The ASCVD Risk score (Arnett DK, et al., 2019) failed to calculate for the following reasons:   The valid HDL cholesterol range is 20 to 100 mg/dL    Assessment & Plan:   Jakeisha was seen today for medical management of chronic issues and hypertension.  Diagnoses and all orders for this visit:  Primary hypertension Well controlled on current regimen. -     CBC with Differential/Platelet -     CMP14+EGFR  Stage 3b chronic kidney disease ([facility]) Labs pending. Established with nephrology.  -     CBC with Differential/Platelet -     CMP14+EGFR  Vitamin D deficiency On repletion therapy. Labs pending.  -     VITAMIN D 25 Hydroxy (Vit-D Deficiency, Fractures)  Mixed hyperlipidemia On statin. Last LDL at goal.   Depression, major, single episode, complete remission ([facility]) Well controlled on current regimen.  -     venlafaxine XR (EFFEXOR-XR) 150 MG 24 hr capsule; Take one 75 mg capsule in addition to one 150 mg capsule daily. -     venlafaxine XR (EFFEXOR-XR) 75 MG 24 hr capsule; Take 1 capsule (75 mg  total) by mouth daily with breakfast.  Fever blister -     valACYclovir (VALTREX) 1000 MG tablet; Take 1 tablet once a day for 5 days at first sign of lesion     Return in about 6 months (around 02/16/2023) for chronic follow up.  The patient indicates understanding of these issues and agrees with the plan.    Gabriel Earing, FNP

## 2022-08-17 DIAGNOSIS — Q438 Other specified congenital malformations of intestine: Secondary | ICD-10-CM | POA: Diagnosis not present

## 2022-08-17 DIAGNOSIS — Z1211 Encounter for screening for malignant neoplasm of colon: Secondary | ICD-10-CM | POA: Diagnosis not present

## 2022-08-19 ENCOUNTER — Other Ambulatory Visit: Payer: Self-pay | Admitting: Family Medicine

## 2022-08-19 DIAGNOSIS — E782 Mixed hyperlipidemia: Secondary | ICD-10-CM

## 2022-10-12 ENCOUNTER — Encounter: Payer: Medicare Other | Admitting: Family Medicine

## 2022-10-14 DIAGNOSIS — H40033 Anatomical narrow angle, bilateral: Secondary | ICD-10-CM | POA: Diagnosis not present

## 2022-10-14 DIAGNOSIS — H04123 Dry eye syndrome of bilateral lacrimal glands: Secondary | ICD-10-CM | POA: Diagnosis not present

## 2022-10-20 ENCOUNTER — Ambulatory Visit: Payer: Medicare Other | Admitting: Orthopaedic Surgery

## 2022-10-20 ENCOUNTER — Encounter: Payer: Self-pay | Admitting: Orthopaedic Surgery

## 2022-10-20 ENCOUNTER — Other Ambulatory Visit (INDEPENDENT_AMBULATORY_CARE_PROVIDER_SITE_OTHER): Payer: Medicare Other

## 2022-10-20 DIAGNOSIS — G8929 Other chronic pain: Secondary | ICD-10-CM

## 2022-10-20 DIAGNOSIS — M25511 Pain in right shoulder: Secondary | ICD-10-CM

## 2022-10-20 MED ORDER — LIDOCAINE HCL 1 % IJ SOLN
3.0000 mL | INTRAMUSCULAR | Status: AC | PRN
Start: 2022-10-20 — End: 2022-10-20
  Administered 2022-10-20: 3 mL

## 2022-10-20 MED ORDER — BUPIVACAINE HCL 0.5 % IJ SOLN
3.0000 mL | INTRAMUSCULAR | Status: AC | PRN
Start: 2022-10-20 — End: 2022-10-20
  Administered 2022-10-20: 3 mL via INTRA_ARTICULAR

## 2022-10-20 MED ORDER — METHYLPREDNISOLONE ACETATE 40 MG/ML IJ SUSP
40.0000 mg | INTRAMUSCULAR | Status: AC | PRN
Start: 2022-10-20 — End: 2022-10-20
  Administered 2022-10-20: 40 mg via INTRA_ARTICULAR

## 2022-10-20 NOTE — Progress Notes (Signed)
Office Visit Note   Patient: Brandy Walker           Date of Birth: 08-18-1955           MRN: [id number] Visit Date: 10/20/2022              Requested by: Gabriel Earing, FNP [address] [place],  [place] [address] PCP: Gabriel Earing, FNP   Assessment & Plan: Visit Diagnoses:  1. Chronic right shoulder pain     Plan: Impression is chronic right shoulder pain which sounds like it is coming from impingement syndrome or could be rotator cuff tendinopathy.  Overall the shoulder is functional and has good strength.  Would like to try a subacromial injection and 6 weeks of relative rest and home exercise program.  She will let us know if she continues to be symptomatic next step would be MRI.  Follow-Up Instructions: No follow-ups on file.   Orders:  Orders Placed This Encounter  Procedures   Large Joint Inj   XR Shoulder Right   No orders of the defined types were placed in this encounter.     Procedures: Large Joint Inj: R subacromial bursa on 10/20/2022 11:21 AM Indications: pain Details: 22 G needle  Arthrogram: No  Medications: 3 mL lidocaine 1 %; 3 mL bupivacaine 0.5 %; 40 mg methylPREDNISolone acetate 40 MG/ML Outcome: tolerated well, no immediate complications Consent was given by the patient. Patient was prepped and draped in the usual sterile fashion.       Clinical Data: No additional findings.   Subjective: Chief Complaint  Patient presents with   Right Shoulder - Pain    HPI patient is a pleasant 67 year old female who comes in today with right shoulder pain for the past 2 weeks.  She denies any injury or change in activity.  She does note an injury 17 years ago where she dislocated her shoulder and had subsequent surgery.  She also notes that she underwent AC joint separation about 8 to 10 years ago.  The pain she is having is primarily to the deltoid.  Worse with certain movements of the shoulder.  She does not note any weakness to the right upper  extremity.  She does take Motrin with mild relief.  She tells me she underwent cortisone injection about 10 years ago which helped.  Review of Systems as detailed in HPI.  All others reviewed and are negative.   Objective: Vital Signs: There were no vitals taken for this visit.  Physical Exam well-developed well-nourished female no acute distress.  Alert and oriented x 3.  Ortho Exam right shoulder exam: Full active range of motion all planes.  She can internally rotate to T12 which does cause pain.  Pain with empty can testing.  No pain with speeds, O'Brien's or cross body adduction.  She does have an obvious deformity from [facility] joint separation which is asymptomatic.  Full strength throughout.  She is neurovascular intact distally.  She has pain with testing of the subscap but strength is preserved.  Specialty Comments:  No specialty comments available.  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  Mild glenohumeral osteoarthritis.  There is presence of 2 metallic staples from prior shoulder stabilization surgery.    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/14/2017   Tobacco abuse 08/20/2015   Hypertension 05/25/2015   Past Medical History:  Diagnosis Date   Depression    Hyperlipidemia    Hypertension     Family History  Problem Relation Age of Onset   Arthritis Mother    Hyperlipidemia Mother    Hypertension Mother    Stroke Father    Diabetes Father    Cancer Sister        breast   Breast cancer Sister    COPD Brother    Anxiety disorder Brother    Depression Brother    ADD / ADHD Daughter    Alcohol abuse Son     Heart disease Maternal Grandmother    Hypertension Maternal Grandmother    Cancer Paternal Grandmother        colon/uteran/ureter    Past Surgical History:  Procedure Laterality Date   AUGMENTATION MAMMAPLASTY     BREAST SURGERY  2001   breast augmentation   shoulder sx     Pins in humerus head  age 46   Social History   Occupational History   Occupation: Charity fundraiser  Tobacco Use   Smoking status: Never   Smokeless tobacco: Never  Vaping Use   Vaping status: Never Used  Substance and Sexual Activity   Alcohol use: Yes    Alcohol/week: 14.0 standard drinks of alcohol    Types: 14 Shots of liquor per week   Drug use: Not Currently    Types: Marijuana    Comment: last time using marijuana 25 years ago   Sexual activity: Yes    Birth control/protection: Post-menopausal

## 2022-11-07 ENCOUNTER — Telehealth: Payer: Self-pay | Admitting: *Deleted

## 2022-11-07 NOTE — Telephone Encounter (Signed)
FYI - Dr. Lucio Edward office call, went over contact information, we have the same information they have. They have not been able to get in contact with the patient to schedule FU care and she has cancelled once or twice before. She is currently not getting kidney care from Dr. Wolfgang Phoenix.

## 2022-11-15 IMAGING — MG DIGITAL SCREENING BREAST BILAT IMPLANT W/ TOMO W/ CAD
8 of 12 series · 8 of 28 positions shown · non-contrast
Comparison: Previous exam(s).

CLINICAL DATA: Screening.

EXAM:
DIGITAL SCREENING BILATERAL MAMMOGRAM WITH IMPLANTS, CAD AND
TOMOSYNTHESIS
TECHNIQUE: Bilateral screening digital craniocaudal and mediolateral oblique
mammograms were obtained. Bilateral screening digital breast
tomosynthesis was performed. The images were evaluated with
computer-aided detection. Standard and/or implant displaced views
were performed.

[R CC]
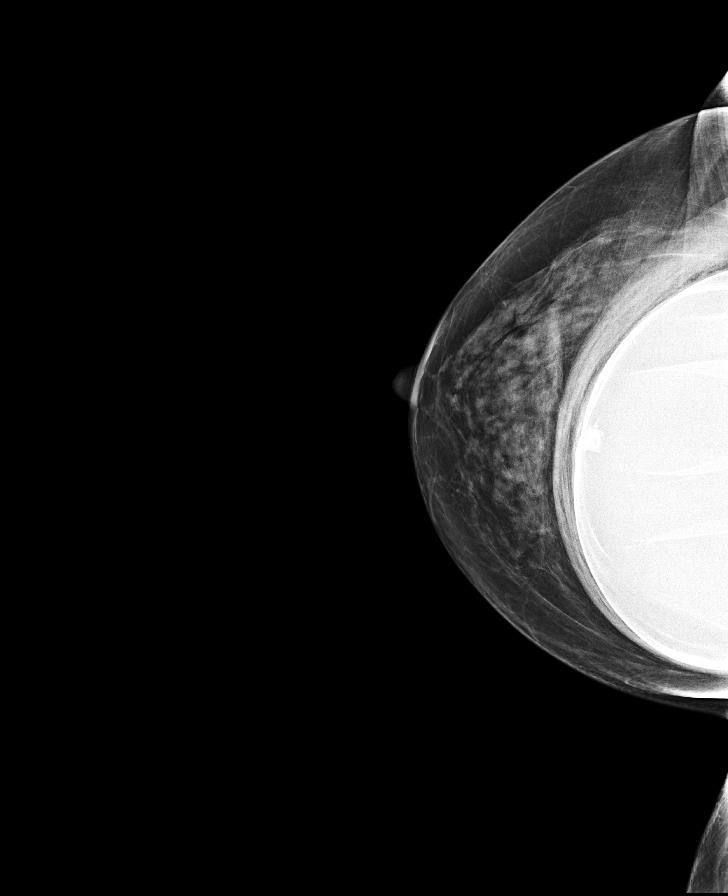

[L CC]
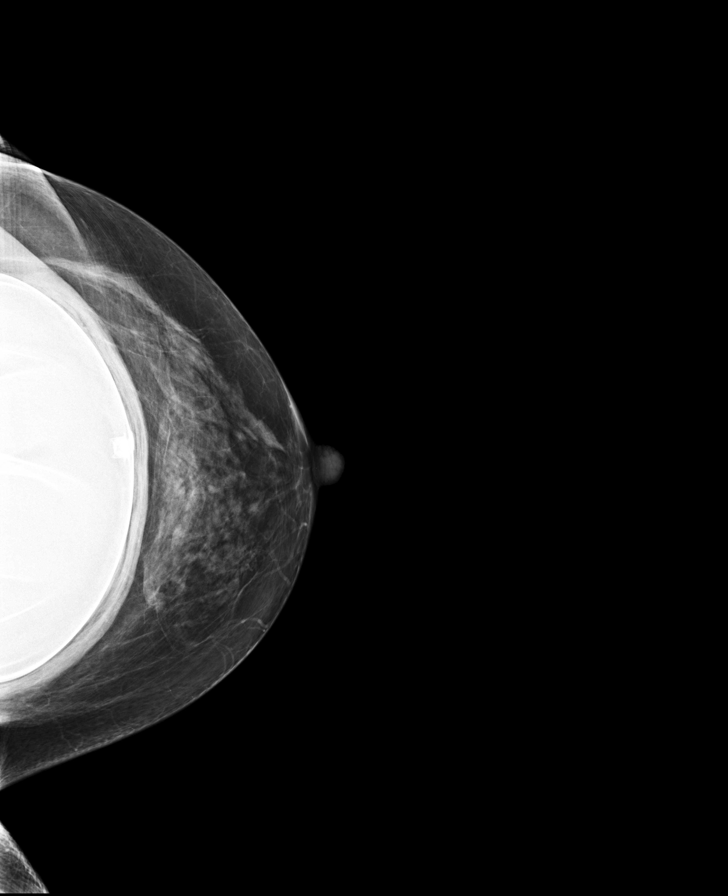

[R MLO]
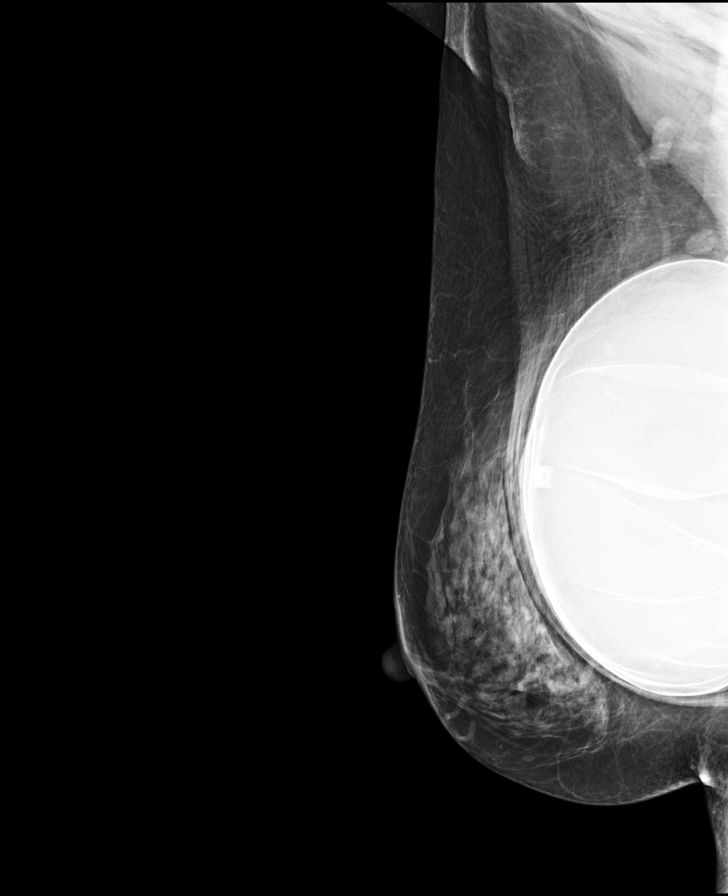

[L MLO]
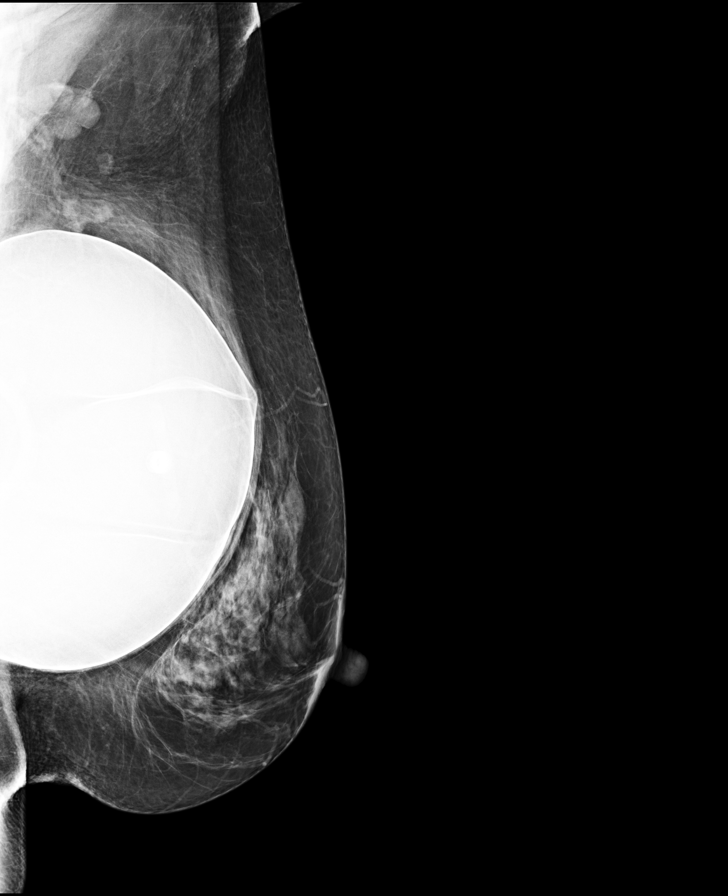

[R MLO synth-2D]
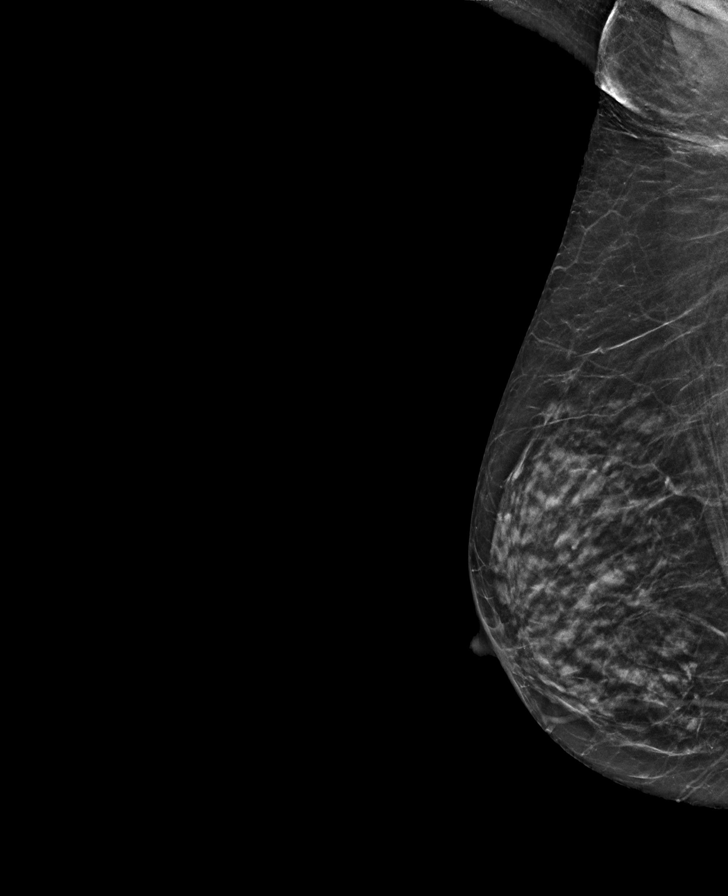

[R CC synth-2D]
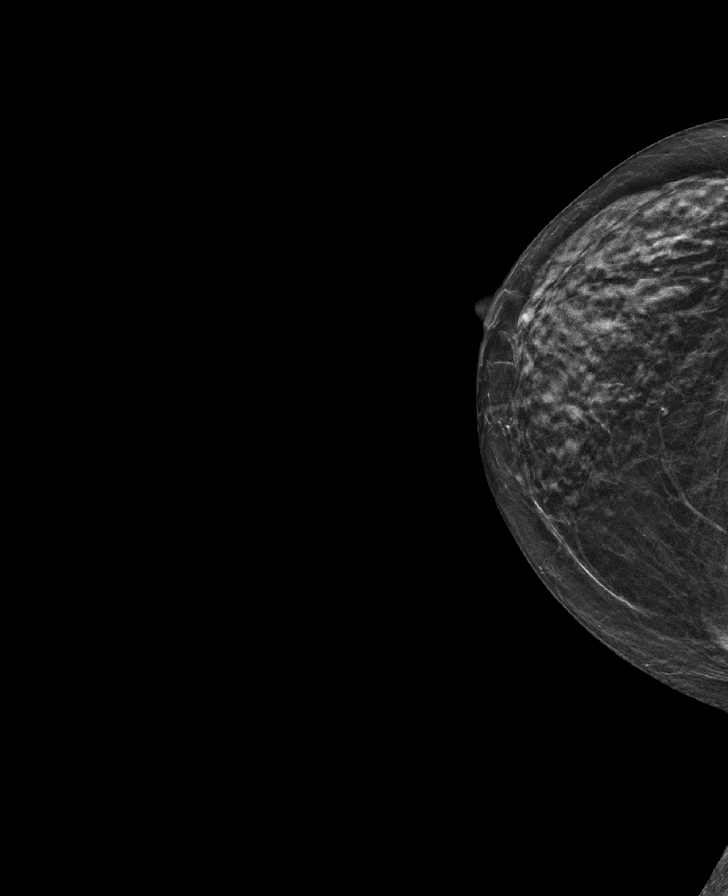

[L MLO synth-2D]
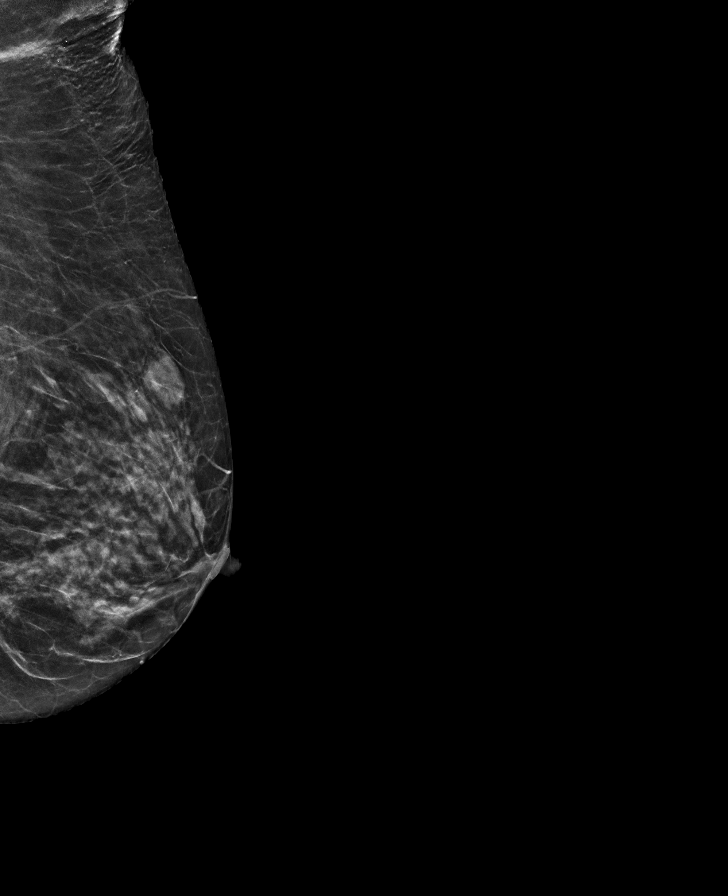

[L CC synth-2D]
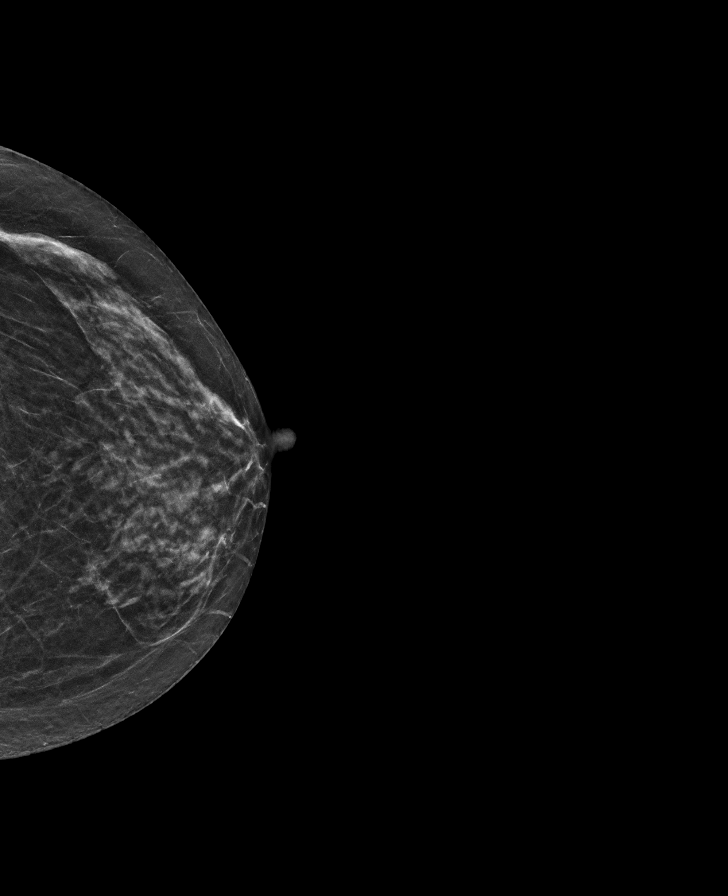

[8 of 28 positions shown; findings below may reference images not displayed]

ACR Breast Density Category c: The breast tissue is heterogeneously
dense, which may obscure small masses.
FINDINGS: The patient has retropectoral implants. There are no findings
suspicious for malignancy.
IMPRESSION: No mammographic evidence of malignancy. A result letter of this
screening mammogram will be mailed directly to the patient.

RECOMMENDATION:
Screening mammogram in one year. (Code:LT-E-7TH)

BI-RADS CATEGORY  1:  Negative.

## 2022-12-01 ENCOUNTER — Ambulatory Visit (INDEPENDENT_AMBULATORY_CARE_PROVIDER_SITE_OTHER): Payer: Medicare Other | Admitting: Family Medicine

## 2022-12-01 ENCOUNTER — Encounter: Payer: Self-pay | Admitting: Family Medicine

## 2022-12-01 VITALS — BP 130/90 | HR 127 | Temp 98.0°F | Ht 61.0 in | Wt 117.2 lb

## 2022-12-01 DIAGNOSIS — I129 Hypertensive chronic kidney disease with stage 1 through stage 4 chronic kidney disease, or unspecified chronic kidney disease: Secondary | ICD-10-CM | POA: Diagnosis not present

## 2022-12-01 DIAGNOSIS — I10 Essential (primary) hypertension: Secondary | ICD-10-CM

## 2022-12-01 DIAGNOSIS — R000 Tachycardia, unspecified: Secondary | ICD-10-CM

## 2022-12-01 DIAGNOSIS — N1832 Chronic kidney disease, stage 3b: Secondary | ICD-10-CM

## 2022-12-01 DIAGNOSIS — N189 Chronic kidney disease, unspecified: Secondary | ICD-10-CM | POA: Diagnosis not present

## 2022-12-01 DIAGNOSIS — R809 Proteinuria, unspecified: Secondary | ICD-10-CM | POA: Diagnosis not present

## 2022-12-01 DIAGNOSIS — D631 Anemia in chronic kidney disease: Secondary | ICD-10-CM | POA: Diagnosis not present

## 2022-12-01 DIAGNOSIS — Z01818 Encounter for other preprocedural examination: Secondary | ICD-10-CM

## 2022-12-01 NOTE — Progress Notes (Signed)
Pt is a 67 y.o. female who is here for preoperative clearance for a facelift.  1) High Risk Cardiac Conditions  1) Recent MI - No.  2) Decompensated Heart Failure - No.  3) Unstable angina - No.  4) Symptomatic arrythmia - No.  5) Sx Valvular Disease - No.  2) Intermediate Risk Factors - DM, CKD, CVA, CHF, CAD - Yes.   CKD  2) Functional Status - > 4 mets (Walk, run, climb stairs) Yes.  Kateri Mc Activity Status Index: 58.2  3) Surgery Specific Risk - High  (Emergency, Vascular, Intra-abdominal, Extensive ops)          Intermediate (Carotid, Head and Neck, Orthopaedic )          Low (Endoscopic, Cataract, Breast )  4) Further Noninvasive evaluation -   1) EKG - Yes.     1) Hx of CVA, CAD, DM, CKD  2) Echo - No.   1) Worsening dyspnea   3) Stress Testing - Active Cardiac Disease - No.  5) Need for medical therapy - Beta Blocker, Statins indicated ? No.   HR is up this morning. Denies symptoms. Reports feeling agitated this morning as her dog ran off and she had to chase him down before the appt. She wears an apple watch and her heart rate is usually 90s.  PE: Vitals:   12/01/22 1053  BP: 119/83  Pulse: (!) 117  Temp: 98 F (36.7 C)  SpO2: 98%   Wt Readings from Last 3 Encounters:  12/01/22 117 lb 3.2 oz (53.2 kg)  08/16/22 122 lb (55.3 kg)  06/15/22 120 lb (54.4 kg)    Physical Examination: General appearance - alert, well appearing, and in no distress and oriented to person, place, and time Mental status - alert, oriented to person, place, and time, normal mood, behavior, speech, dress, motor activity, and thought processes Eyes - pupils equal and reactive, extraocular eye movements intact Ears - bilateral TM's and external ear canals normal Nose - normal and patent, no erythema, discharge or polyps Mouth - mucous membranes moist, pharynx normal without lesions Neck - supple, no significant adenopathy Chest - clear to auscultation, no wheezes, rales or rhonchi,  symmetric air entry Heart - normal rate, regular rhythm, normal S1, S2, no murmurs, rubs, clicks or gallops Abdomen - soft, nontender, nondistended, no masses or organomegaly Neurological - alert, oriented, normal speech, no focal findings or movement disorder noted Musculoskeletal - no joint tenderness, deformity or swelling Extremities - peripheral pulses normal, no pedal edema, no clubbing or cyanosis Skin - normal coloration and turgor, no rashes, no suspicious skin lesions noted  No problem-specific Assessment & Plan notes found for this encounter.    I have independently evaluated patient.  Sierah A Dyas is a 67 y.o. female who is intermediate risk for a low risk surgery.  There are/ are not modifiable risk factors (smoking, etc). Takiera A Hudson's RCRI/NSQIP calculation for MACE is: 0.9-1.7%.    NO CXR (if asx/healthy/no resp issues don't need) YES- REQUESTED FROM SURGEON EKG (?h/o MI, CAD, etc; usually don't need for low risk sx) NO PFTs (significant cardiopulm hx? OSA/OHS) NO Echo (get if CHF and has not had in >1 year OR if worse HF symptoms) Review meds: NO ACE-I/ARB day of surgery. OK to do BB or statin day of esp if vascular sx)  [place] was seen today for pre-op exam.  Diagnoses and all orders for this visit:  Pre-op exam Labs pending. Will fax  form for clearance pending lab results. EKG with sinus tachy, no significant abnormalities. Asymptomatic. HR 90s at home.  -     EKG 12-Lead -     CBC with Differential/Platelet -     CMP14+EGFR  Primary hypertension BP at goal.   Stage 3b chronic kidney disease (HCC) She has schedule an appt with nephrology for follow up. Labs today.   The patient indicates understanding of these issues and agrees with the plan.  Harlow Mares, FNP [place] [facility]

## 2022-12-02 LAB — CBC WITH DIFFERENTIAL/PLATELET
Basophils Absolute: 0.1 10*3/uL (ref 0.0–0.2)
Basos: 1 %
EOS (ABSOLUTE): 0.2 10*3/uL (ref 0.0–0.4)
Eos: 3 %
Hematocrit: 37.4 % (ref 34.0–46.6)
Hemoglobin: 12.5 g/dL (ref 11.1–15.9)
Immature Grans (Abs): 0 10*3/uL (ref 0.0–0.1)
Immature Granulocytes: 0 %
Lymphocytes Absolute: 2.3 10*3/uL (ref 0.7–3.1)
Lymphs: 33 %
MCH: 31.6 pg (ref 26.6–33.0)
MCHC: 33.4 g/dL (ref 31.5–35.7)
MCV: 94 fL (ref 79–97)
Monocytes Absolute: 0.7 10*3/uL (ref 0.1–0.9)
Monocytes: 10 %
Neutrophils Absolute: 3.6 10*3/uL (ref 1.4–7.0)
Neutrophils: 53 %
Platelets: 352 10*3/uL (ref 150–450)
RBC: 3.96 x10E6/uL (ref 3.77–5.28)
RDW: 12.6 % (ref 11.7–15.4)
WBC: 6.8 10*3/uL (ref 3.4–10.8)

## 2022-12-02 LAB — CMP14+EGFR
ALT: 23 [IU]/L (ref 0–32)
AST: 29 IU/L (ref 0–40)
Albumin: 4.4 g/dL (ref 3.9–4.9)
Alkaline Phosphatase: 92 [IU]/L (ref 44–121)
BUN/Creatinine Ratio: 17 (ref 12–28)
BUN: 24 mg/dL (ref 8–27)
Bilirubin Total: 0.3 mg/dL (ref 0.0–1.2)
CO2: 24 mmol/L (ref 20–29)
Calcium: 9.8 mg/dL (ref 8.7–10.3)
Chloride: 103 mmol/L (ref 96–106)
Creatinine, Ser: 1.4 mg/dL — ABNORMAL HIGH (ref 0.57–1.00)
Globulin, Total: 2.5 g/dL (ref 1.5–4.5)
Glucose: 72 mg/dL (ref 70–99)
Potassium: 4.7 mmol/L (ref 3.5–5.2)
Sodium: 142 mmol/L (ref 134–144)
Total Protein: 6.9 g/dL (ref 6.0–8.5)
eGFR: 42 mL/min/{1.73_m2} — ABNORMAL LOW (ref >59)

## 2022-12-06 ENCOUNTER — Ambulatory Visit: Payer: Medicare Other | Admitting: Family Medicine

## 2022-12-06 ENCOUNTER — Encounter: Payer: Self-pay | Admitting: Family Medicine

## 2022-12-06 VITALS — BP 134/86 | HR 92 | Temp 97.4°F | Ht 61.0 in | Wt 117.0 lb

## 2022-12-06 DIAGNOSIS — R000 Tachycardia, unspecified: Secondary | ICD-10-CM

## 2022-12-06 DIAGNOSIS — I10 Essential (primary) hypertension: Secondary | ICD-10-CM

## 2022-12-06 NOTE — Progress Notes (Signed)
   Acute Office Visit  Subjective:     Patient ID: Brandy Walker, female    DOB: February 22, 1955, 67 y.o.   MRN: [id number]  Chief Complaint  Patient presents with   Follow-up    HPI Patient is in today for follow up of tachycardia. Her plastic surgeon is requesting a repeat EKG as her last EKG showed sinus tachycardia before proceeding with surgery. She has not had any symptoms and hasn't had any elevated HR when checking her apple watch.   ROS As per HPI.      Objective:    BP 134/86   Pulse 92   Temp (!) 97.4 F (36.3 C) (Temporal)   Ht 5\' 1"  (1.549 m)   Wt 117 lb (53.1 kg)   SpO2 97%   BMI 22.11 kg/m  BP Readings from Last 3 Encounters:  12/06/22 134/86  12/01/22 (!) 130/90  08/16/22 128/82      Physical Exam Vitals and nursing note reviewed.  Constitutional:      General: She is not in acute distress.    Appearance: She is not ill-appearing, toxic-appearing or diaphoretic.  Cardiovascular:     Rate and Rhythm: Normal rate and regular rhythm.     Heart sounds: Normal heart sounds. No murmur heard. Pulmonary:     Effort: Pulmonary effort is normal. No respiratory distress.     Breath sounds: Normal breath sounds.  Musculoskeletal:     Right lower leg: No edema.     Left lower leg: No edema.  Skin:    General: Skin is warm and dry.  Neurological:     General: No focal deficit present.     Mental Status: She is alert and oriented to person, place, and time.  Psychiatric:        Mood and Affect: Mood normal.        Behavior: Behavior normal.     No results found for any visits on 12/06/22.      Assessment & Plan:   Sharay was seen today for follow-up.  Diagnoses and all orders for this visit:  Sinus tachycardia EKG with sinus rhythm today. No acute changes on EKG today from previous. Will fax to surgeon.  -     EKG 12-Lead  Primary hypertension BP at goal.   Return to office for new or worsening symptoms, or if symptoms persist.   The patient  indicates understanding of these issues and agrees with the plan.  Gabriel Earing, FNP

## 2022-12-09 DIAGNOSIS — I129 Hypertensive chronic kidney disease with stage 1 through stage 4 chronic kidney disease, or unspecified chronic kidney disease: Secondary | ICD-10-CM | POA: Diagnosis not present

## 2022-12-09 DIAGNOSIS — R809 Proteinuria, unspecified: Secondary | ICD-10-CM | POA: Diagnosis not present

## 2022-12-09 DIAGNOSIS — N1832 Chronic kidney disease, stage 3b: Secondary | ICD-10-CM | POA: Diagnosis not present

## 2022-12-09 DIAGNOSIS — E559 Vitamin D deficiency, unspecified: Secondary | ICD-10-CM | POA: Diagnosis not present

## 2022-12-18 ENCOUNTER — Telehealth: Payer: Self-pay | Admitting: Family Medicine

## 2022-12-18 NOTE — Telephone Encounter (Signed)
Copied from CRM [id number]. Topic: Clinical - Lab/Test Results >> Dec 18, 2022  3:46 PM Maxwell Marion wrote: Reason for CRM: Pt needs repeat EKG sent to [facility]

## 2022-12-19 DIAGNOSIS — Z01810 Encounter for preprocedural cardiovascular examination: Secondary | ICD-10-CM | POA: Insufficient documentation

## 2022-12-19 DIAGNOSIS — E785 Hyperlipidemia, unspecified: Secondary | ICD-10-CM | POA: Insufficient documentation

## 2022-12-19 NOTE — Progress Notes (Unsigned)
Cardiology Office Note   Date:  12/20/2022   ID:  CHERAE KIMAK, DOB [birth year], MRN [id number]  PCP:  Gabriel Earing, FNP  Cardiologist:   Rollene Rotunda, MD Referring:  Gabriel Earing, FNP   Chief Complaint  Patient presents with   Pre-op Exam      History of Present Illness: Brandy Walker is a 67 y.o. female who presents for preop evaluation.  She is going to have some cosmetic surgery and she was noted to have a short PR interval on her EKG.  She was sent by anesthesiology. The patient denies any new symptoms such as chest discomfort, neck or arm discomfort. There has been no new shortness of breath, PND or orthopnea. There have been no reported palpitations, presyncope or syncope.   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  She has not had any cardiovascular complaints.  She did have a workup in 2017 because of some vague symptoms and I saw that she had a negative perfusion study.  She also reports an echocardiogram that was normal.  She has some hypertension is controlled.  She has some dyslipidemia.   Past Medical History:  Diagnosis Date   Depression    Hyperlipidemia    Hypertension     Past Surgical History:  Procedure Laterality Date   AUGMENTATION MAMMAPLASTY     BREAST SURGERY  2001   breast augmentation   shoulder sx     Pins in humerus head  age 33     Current Outpatient Medications  Medication Sig Dispense Refill   amLODipine (NORVASC) 10 MG tablet Take 1 tablet (10 mg total) by mouth daily. 90 tablet 3   Cholecalciferol 50 MCG (2000 UT) CAPS Take 1 tablet by mouth daily.     rosuvastatin (CRESTOR) 5 MG tablet TAKE 1 TABLET (5 MG TOTAL) BY MOUTH DAILY. 90 tablet 1   triamcinolone ointment (KENALOG) 0.5 % Apply 1 Application topically 2 (two) times daily. 30 g 2   valACYclovir (VALTREX) 1000 MG tablet Take 1 tablet once a day for 5 days at first sign of lesion 20 tablet 1   venlafaxine XR (EFFEXOR-XR) 150 MG 24 hr capsule Take one 75 mg capsule  in addition to one 150 mg capsule daily. 90 capsule 3   venlafaxine XR (EFFEXOR-XR) 75 MG 24 hr capsule Take 1 capsule (75 mg total) by mouth daily with breakfast. 90 capsule 3   No current facility-administered medications for this visit.    Allergies:   Lisinopril    Social History:  The patient  reports that she has never smoked. She has never used smokeless tobacco. She reports current alcohol use of about 14.0 standard drinks of alcohol per week. She reports that she does not currently use drugs after having used the following drugs: Marijuana.   Family History:  The patient's family history includes ADD / ADHD in her daughter; Alcohol abuse in her son; Anxiety disorder in her brother; Arthritis in her mother; Atrial fibrillation in her mother; Breast cancer in her sister; COPD in her brother; Cancer in her paternal grandmother and sister; Depression in her brother; Diabetes in her father; Heart disease in her maternal grandmother; Hyperlipidemia in her mother; Hypertension in her maternal grandmother and mother; Stroke in her father.    ROS:  Please see the history of present illness.   Otherwise, review of systems are positive for none.   All other systems are reviewed and negative.  PHYSICAL EXAM: VS:  BP 128/82 (BP Location: Left Arm, Patient Position: Sitting, Cuff Size: Normal)   Pulse (!) 105   Ht 5\' 3"  (1.6 m)   Wt 120 lb 6.4 oz (54.6 kg)   SpO2 98%   BMI 21.33 kg/m  , BMI Body mass index is 21.33 kg/m. GENERAL:  Well appearing HEENT:  Pupils equal round and reactive, fundi not visualized, oral mucosa unremarkable NECK:  No jugular venous distention, waveform within normal limits, carotid upstroke brisk and symmetric, no bruits, no thyromegaly LYMPHATICS:  No cervical, inguinal adenopathy LUNGS:  Clear to auscultation bilaterally BACK:  No CVA tenderness CHEST:  Unremarkable HEART:  PMI not displaced or sustained,S1 and S2 within normal limits, no S3, no S4, no clicks,  no rubs, no murmurs ABD:  Flat, positive bowel sounds normal in frequency in pitch, no bruits, no rebound, no guarding, no midline pulsatile mass, no hepatomegaly, no splenomegaly EXT:  2 plus pulses throughout, no edema, no cyanosis no clubbing SKIN:  No rashes no nodules NEURO:  Cranial nerves II through XII grossly intact, motor grossly intact throughout PSYCH:  Cognitively intact, oriented to person place and time    EKG: Sinus rhythm, rate 88, axis within normal limits, intervals within normal limits except short PR without delta wave, no acute ST-T wave changes.  12/06/22    Recent Labs: 12/01/2022: ALT 23; BUN 24; Creatinine, Ser 1.40; Hemoglobin 12.5; Platelets 352; Potassium 4.7; Sodium 142    Lipid Panel    Component Value Date/Time   CHOL 199 03/06/2022 1245   TRIG 73 03/06/2022 1245   HDL 116 03/06/2022 1245   CHOLHDL 1.7 03/06/2022 1245   LDLCALC 70 03/06/2022 1245      Wt Readings from Last 3 Encounters:  12/20/22 120 lb 6.4 oz (54.6 kg)  12/06/22 117 lb (53.1 kg)  12/01/22 117 lb 3.2 oz (53.2 kg)      Other studies Reviewed: Additional studies/ records that were reviewed today include: EKG, labs. Review of the above records demonstrates:  Please see elsewhere in the note.     ASSESSMENT AND PLAN:  Preop: Patient has no symptoms.  She is not going for high risk procedure.  She has an excellent functional level.  There are no high risk findings.  No change in therapy.  No preoperative testing is indicated.  Patient is at acceptable risk for the planned procedure.  Dyslipidemia: She would prefer not to take a statin.  She has an excellent HDL of 116 and LDL 70.  I am going to order a coronary calcium score and if this is 0 I would suggest coming off the statin.  HTN: Her blood pressure is controlled but see below.  CKD 2: The patient does have some chronic renal insufficiency that might be stage IIIa.  She says she is about to start valsartan at the  direction of her nephrologist and that she still some protein.   Current medicines are reviewed at length with the patient today.  The patient does not have concerns regarding medicines.  The following changes have been made:  no change  Labs/ tests ordered today include: None  Orders Placed This Encounter  Procedures   CT CARDIAC SCORING (SELF PAY ONLY)     Disposition:   FU with with me as needed.     Signed, Rollene Rotunda, MD  12/20/2022 10:41 AM    [facility]

## 2022-12-20 ENCOUNTER — Ambulatory Visit: Payer: Medicare Other

## 2022-12-20 ENCOUNTER — Encounter: Payer: Self-pay | Admitting: Cardiology

## 2022-12-20 ENCOUNTER — Ambulatory Visit: Payer: Medicare Other | Attending: Cardiology | Admitting: Cardiology

## 2022-12-20 ENCOUNTER — Telehealth: Payer: Self-pay

## 2022-12-20 VITALS — BP 128/82 | HR 105 | Ht 63.0 in | Wt 120.4 lb

## 2022-12-20 DIAGNOSIS — Z01810 Encounter for preprocedural cardiovascular examination: Secondary | ICD-10-CM | POA: Diagnosis not present

## 2022-12-20 DIAGNOSIS — E785 Hyperlipidemia, unspecified: Secondary | ICD-10-CM

## 2022-12-20 DIAGNOSIS — I10 Essential (primary) hypertension: Secondary | ICD-10-CM

## 2022-12-20 NOTE — Telephone Encounter (Signed)
....     Pre-operative Risk Assessment    Patient Name: Brandy Walker  DOB: [birth year] MRN: [id number]      Request for Surgical Clearance    Procedure:   FACELIFT  Date of Surgery:  Clearance TBD   WOULD LIKE TO HAVE CLEARANCE BY 12/22/22                              Surgeon:  DR Vanessa Kick Surgeon's Group or Practice Name:  [facility] Phone number:  [phone] Fax number:  [phone]   Type of Clearance Requested:   - Medical    Type of Anesthesia:  Not Indicated   Additional requests/questions:   NEW PATIENT  Signed, Renee Ramus   12/20/2022, 8:51 AM

## 2022-12-20 NOTE — Telephone Encounter (Signed)
   Name: Brandy Walker  DOB: 1955/08/07  MRN: [id number]  Primary Cardiologist: None  Chart reviewed as part of pre-operative protocol coverage. The patient has an upcoming visit scheduled with Dr. Antoine Poche on 12/20/22 at which time clearance can be addressed in case there are any issues that would impact surgical recommendations.  I will route this message as FYI to requesting party and remove this message from the preop box as separate preop APP input not needed at this time.   Please call with any questions.  Napoleon Form, Leodis Rains, NP  12/20/2022, 8:57 AM

## 2022-12-20 NOTE — Patient Instructions (Signed)
Medication Instructions:  No changes. *If you need a refill on your cardiac medications before your next appointment, please call your pharmacy*   Testing/Procedures: Dr Antoine Poche has ordered a CT coronary calcium score.   Test locations:  [facility]   [facility] at [facility]  This is $99 out of pocket.   Coronary CalciumScan A coronary calcium scan is an imaging test used to look for deposits of calcium and other fatty materials (plaques) in the inner lining of the blood vessels of the heart (coronary arteries). These deposits of calcium and plaques can partly clog and narrow the coronary arteries without producing any symptoms or warning signs. This puts a person at risk for a heart attack. This test can detect these deposits before symptoms develop. Tell a health care provider about: Any allergies you have. All medicines you are taking, including vitamins, herbs, eye drops, creams, and over-the-counter medicines. Any problems you or family members have had with anesthetic medicines. Any blood disorders you have. Any surgeries you have had. Any medical conditions you have. Whether you are pregnant or may be pregnant. What are the risks? Generally, this is a safe procedure. However, problems may occur, including: Harm to a pregnant woman and her unborn baby. This test involves the use of radiation. Radiation exposure can be dangerous to a pregnant woman and her unborn baby. If you are pregnant, you generally should not have this procedure done. Slight increase in the risk of cancer. This is because of the radiation involved in the test. What happens before the procedure? No preparation is needed for this procedure. What happens during the procedure? You will undress and remove any jewelry around your neck or chest. You will put on a hospital gown. Sticky electrodes will be placed on your chest. The  electrodes will be connected to an electrocardiogram (ECG) machine to record a tracing of the electrical activity of your heart. A CT scanner will take pictures of your heart. During this time, you will be asked to lie still and hold your breath for 2-3 seconds while a picture of your heart is being taken. The procedure may vary among health care providers and hospitals. What happens after the procedure? You can get dressed. You can return to your normal activities. It is up to you to get the results of your test. Ask your health care provider, or the department that is doing the test, when your results will be ready. Summary A coronary calcium scan is an imaging test used to look for deposits of calcium and other fatty materials (plaques) in the inner lining of the blood vessels of the heart (coronary arteries). Generally, this is a safe procedure. Tell your health care provider if you are pregnant or may be pregnant. No preparation is needed for this procedure. A CT scanner will take pictures of your heart. You can return to your normal activities after the scan is done. This information is not intended to replace advice given to you by your health care provider. Make sure you discuss any questions you have with your health care provider. Document Released: 06/24/2007 Document Revised: 11/15/2015 Document Reviewed: 11/15/2015 Elsevier Interactive Patient Education  2017 [employer].    Follow-Up: At [facility], you and your health needs are our priority.  As part of our continuing mission to provide you with exceptional heart care, we have created designated Provider Care Teams.  These Care Teams include your primary  Cardiologist (physician) and Advanced Practice Providers (APPs -  Physician Assistants and Nurse Practitioners) who all work together to provide you with the care you need, when you need it.  We recommend signing up for the patient portal called "MyChart".  Sign up  information is provided on this After Visit Summary.  MyChart is used to connect with patients for Virtual Visits (Telemedicine).  Patients are able to view lab/test results, encounter notes, upcoming appointments, etc.  Non-urgent messages can be sent to your provider as well.   To learn more about what you can do with MyChart, go to [email or web address].    Your next appointent:     As needed.   Provider:   Rollene Rotunda, MD

## 2022-12-22 ENCOUNTER — Encounter: Payer: Self-pay | Admitting: Family Medicine

## 2022-12-22 ENCOUNTER — Ambulatory Visit (INDEPENDENT_AMBULATORY_CARE_PROVIDER_SITE_OTHER): Payer: Medicare Other | Admitting: Family Medicine

## 2022-12-22 VITALS — Wt 122.2 lb

## 2022-12-22 DIAGNOSIS — N1832 Chronic kidney disease, stage 3b: Secondary | ICD-10-CM

## 2022-12-22 DIAGNOSIS — Z0000 Encounter for general adult medical examination without abnormal findings: Secondary | ICD-10-CM

## 2022-12-22 DIAGNOSIS — I10 Essential (primary) hypertension: Secondary | ICD-10-CM

## 2022-12-22 DIAGNOSIS — Z0001 Encounter for general adult medical examination with abnormal findings: Secondary | ICD-10-CM | POA: Diagnosis not present

## 2022-12-22 NOTE — Telephone Encounter (Signed)
  Pt is calling to f/u clearance. Pt saw Dr. Antoine Poche on 12/20/22. She would like to request if the clearance can be send out before Tuesday

## 2022-12-22 NOTE — Progress Notes (Signed)
Subjective:    Brandy Walker is a 67 y.o. female who presents for a Welcome to Medicare exam.          Objective:    Today's Vitals   12/22/22 1045  Weight: 122 lb 3.2 oz (55.4 kg)  Body mass index is 21.65 kg/m.  Medications Outpatient Encounter Medications as of 12/22/2022  Medication Sig   amLODipine (NORVASC) 10 MG tablet Take 1 tablet (10 mg total) by mouth daily.   Cholecalciferol 50 MCG (2000 UT) CAPS Take 1 tablet by mouth daily.   olmesartan (BENICAR) 5 MG tablet Take 5 mg by mouth daily.   rosuvastatin (CRESTOR) 5 MG tablet TAKE 1 TABLET (5 MG TOTAL) BY MOUTH DAILY.   triamcinolone ointment (KENALOG) 0.5 % Apply 1 Application topically 2 (two) times daily.   valACYclovir (VALTREX) 1000 MG tablet Take 1 tablet once a day for 5 days at first sign of lesion   venlafaxine XR (EFFEXOR-XR) 150 MG 24 hr capsule Take one 75 mg capsule in addition to one 150 mg capsule daily.   venlafaxine XR (EFFEXOR-XR) 75 MG 24 hr capsule Take 1 capsule (75 mg total) by mouth daily with breakfast.   No facility-administered encounter medications on file as of 12/22/2022.     History: Past Medical History:  Diagnosis Date   Chronic kidney disease    Depression    Hyperlipidemia    Hypertension    Past Surgical History:  Procedure Laterality Date   AUGMENTATION MAMMAPLASTY     BREAST SURGERY  2001   breast augmentation   shoulder sx     Pins in humerus head  age 45    Family History  Problem Relation Age of Onset   Arthritis Mother    Hyperlipidemia Mother    Hypertension Mother    Atrial fibrillation Mother    Stroke Father    Diabetes Father    Cancer Sister        breast   Breast cancer Sister    COPD Brother    Anxiety disorder Brother    Depression Brother    Heart disease Maternal Grandmother    Hypertension Maternal Grandmother    Cancer Paternal Grandmother        colon/uteran/ureter   ADD / ADHD Daughter    Alcohol abuse Son    Social History    Occupational History   Occupation: Charity fundraiser  Tobacco Use   Smoking status: Never   Smokeless tobacco: Never  Vaping Use   Vaping status: Never Used  Substance and Sexual Activity   Alcohol use: Yes    Alcohol/week: 14.0 standard drinks of alcohol    Types: 14 Shots of liquor per week   Drug use: Not Currently    Types: Marijuana    Comment: last time using marijuana 25 years ago   Sexual activity: Yes    Birth control/protection: Post-menopausal    Tobacco Counseling Counseling given: Not Answered   Immunizations and Health Maintenance Immunization History  Administered Date(s) Administered   Fluad Trivalent(High Dose 65+) 11/25/2022   Influenza Split 10/22/2013, 11/02/2014, 10/28/2015   Influenza, Quadrivalent, Recombinant, Inj, Pf 10/12/2016   Influenza,inj,Quad PF,6+ Mos 11/26/2020, 10/17/2021   Influenza,trivalent, recombinat, inj, PF 10/12/2016   Influenza-Unspecified 10/22/2013, 11/02/2014, 10/28/2015, 11/18/2019   Moderna Covid-19 Fall Seasonal Vaccine 47yrs & older 11/25/2022   Moderna Sars-Covid-2 Vaccination 03/11/2019, 04/08/2019, 12/16/2019   PNEUMOCOCCAL CONJUGATE-20 06/15/2022   PPD Test 10/12/2016   Td 01/15/2012   Td (Adult),5 Lf Tetanus  Toxid, Preservative Free 01/15/2012   Tdap 03/31/2017   Health Maintenance Due  Topic Date Due   DEXA SCAN  Never done   MAMMOGRAM  10/18/2022    Activities of Daily Living    12/22/2022   11:14 AM  In your present state of health, do you have any difficulty performing the following activities:  Hearing? 0  Vision? 0  Difficulty concentrating or making decisions? 0  Walking or climbing stairs? 0  Dressing or bathing? 0  Doing errands, shopping? 0    Physical Exam   Physical Exam (optional), or other factors deemed appropriate based on the beneficiary's medical and social history and current clinical standards.   Advanced Directives: Does Patient Have a Medical Advance Directive?: No Would patient like  information on creating a medical advance directive?: Yes (MAU/Ambulatory/Procedural Areas - Information given)  EKG:  Reviewed recent EKG. Has established with cardiology.      Assessment:    This is a routine wellness examination for this patient .   Vision/Hearing screen No results found.   Goals       Exercise 150 min/wk Moderate Activity (pt-stated)       Depression Screen    12/22/2022   11:03 AM 08/16/2022    2:49 PM 03/30/2022    9:43 AM 02/23/2022    4:25 PM  PHQ 2/9 Scores  PHQ - 2 Score 0 0 0 0  PHQ- 9 Score 0 0 0 0     Fall Risk    12/22/2022   11:03 AM  Fall Risk   Falls in the past year? 0    Cognitive Function:    12/22/2022   10:57 AM  MMSE - Mini Mental State Exam  Orientation to time 5  Orientation to Place 5  Registration 3  Attention/ Calculation 5  Recall 1  Language- name 2 objects 2  Language- repeat 1  Language- follow 3 step command 3  Language- read & follow direction 1  Write a sentence 1  Copy design 1  Total score 28        Patient Care Team: Gabriel Earing, FNP as PCP - General (Family Medicine) Rollene Rotunda, MD as PCP - Cardiology (Cardiology)     Plan:     Malissia was seen today for medicare wellness.  Diagnoses and all orders for this visit:  Encounter for Medicare annual wellness exam  Healthcare maintenance She will schedule mammogram on mobile bus. DEXA is not available today, will plan to do at her follow up appt. Declined shingrix.   Primary hypertension Well controlled on current regimen.   Stage 3b chronic kidney disease (HCC) Established with nephology. Recently started on olmesartan and had labs done.    I have personally reviewed and noted the following in the patient's chart:   Medical and social history Use of alcohol, tobacco or illicit drugs  Current medications and supplements Functional ability and status Nutritional status Physical activity Advanced directives List of other  physicians Hospitalizations, surgeries, and ER visits in previous 12 months Vitals Screenings to include cognitive, depression, and falls Referrals and appointments  In addition, I have reviewed and discussed with patient certain preventive protocols, quality metrics, and best practice recommendations. A written personalized care plan for preventive services as well as general preventive health recommendations were provided to patient.     Gabriel Earing, FNP 12/22/2022

## 2022-12-22 NOTE — Telephone Encounter (Signed)
Sent over Dr. Jenene Slicker office note / preop clearance to # provided.

## 2022-12-25 ENCOUNTER — Telehealth: Payer: Self-pay | Admitting: Cardiology

## 2022-12-25 NOTE — Telephone Encounter (Signed)
Clearance notes have been re-faxed  ?

## 2022-12-25 NOTE — Telephone Encounter (Signed)
Office calling back to state one of the pre op clearance pages was cut off and they would like it to be resent over. Please advise

## 2023-01-05 ENCOUNTER — Telehealth: Payer: Self-pay | Admitting: Pharmacist

## 2023-01-05 NOTE — Telephone Encounter (Signed)
   This patient is appearing on a report for being at risk of failing the adherence measure for cholesterol (statin) medications this calendar year.   Medication: rosuvastatin Last fill date: 12/27/22 for 90 day supply Once chart refreshed--fill date updated to 12/18  PCP on 02/19/23 for additional refills  Kieth Brightly, PharmD, BCACP, CPP Clinical Pharmacist, [facility]

## 2023-02-12 ENCOUNTER — Other Ambulatory Visit: Payer: Self-pay | Admitting: Family Medicine

## 2023-02-12 DIAGNOSIS — Z1231 Encounter for screening mammogram for malignant neoplasm of breast: Secondary | ICD-10-CM

## 2023-02-14 ENCOUNTER — Ambulatory Visit (HOSPITAL_BASED_OUTPATIENT_CLINIC_OR_DEPARTMENT_OTHER)
Admission: RE | Admit: 2023-02-14 | Discharge: 2023-02-14 | Disposition: A | Discharge status: Home / Self Care | Payer: Self-pay | Source: Ambulatory Visit | Attending: Cardiology | Admitting: Cardiology

## 2023-02-14 DIAGNOSIS — Z01810 Encounter for preprocedural cardiovascular examination: Secondary | ICD-10-CM | POA: Insufficient documentation

## 2023-02-19 ENCOUNTER — Encounter: Payer: Self-pay | Admitting: Family Medicine

## 2023-02-19 ENCOUNTER — Ambulatory Visit
Admission: RE | Admit: 2023-02-19 | Discharge: 2023-02-19 | Disposition: A | Discharge status: Home / Self Care | Payer: Medicare Other | Source: Ambulatory Visit | Attending: Family Medicine | Admitting: Family Medicine

## 2023-02-19 ENCOUNTER — Ambulatory Visit (INDEPENDENT_AMBULATORY_CARE_PROVIDER_SITE_OTHER): Payer: Medicare Other | Admitting: Family Medicine

## 2023-02-19 VITALS — BP 135/86 | HR 96 | Temp 96.7°F | Ht 63.0 in | Wt 121.6 lb

## 2023-02-19 DIAGNOSIS — F325 Major depressive disorder, single episode, in full remission: Secondary | ICD-10-CM

## 2023-02-19 DIAGNOSIS — I10 Essential (primary) hypertension: Secondary | ICD-10-CM

## 2023-02-19 DIAGNOSIS — Z1231 Encounter for screening mammogram for malignant neoplasm of breast: Secondary | ICD-10-CM

## 2023-02-19 DIAGNOSIS — L2082 Flexural eczema: Secondary | ICD-10-CM | POA: Insufficient documentation

## 2023-02-19 DIAGNOSIS — E782 Mixed hyperlipidemia: Secondary | ICD-10-CM | POA: Diagnosis not present

## 2023-02-19 DIAGNOSIS — N1832 Chronic kidney disease, stage 3b: Secondary | ICD-10-CM | POA: Diagnosis not present

## 2023-02-19 DIAGNOSIS — E559 Vitamin D deficiency, unspecified: Secondary | ICD-10-CM

## 2023-02-19 MED ORDER — TRIAMCINOLONE ACETONIDE 0.5 % EX OINT: 1.0000 | TOPICAL_OINTMENT | Freq: Two times a day (BID) | CUTANEOUS | 2 refills | Status: DC

## 2023-02-19 NOTE — Progress Notes (Signed)
 Established Patient Office Visit  Subjective   Patient ID: Brandy Walker, female    DOB: 1955-12-05  Age: 67 y.o. MRN: [id number]  Chief Complaint  Patient presents with   Medical Management of Chronic Issues    HPI  HTN Complaint with meds - Yes Current Medications - amlodipine  10 mg  Pertinent ROS:  Headache - No Fatigue - No Visual Disturbances - No Chest pain - No Dyspnea - No Palpitations - No LE edema - No  2. CKD Established with nephrology. Will follow up with him in 1 month and will have labs prior.  3. Vitamin D  On daily supplement 2000 international units.   4. Depression Well controlled with effexor .   5. Eczema Needs refill of kenalog . Using gold bond eczema. Regularly has red, flaky, itchiness to bilateral lower leg. Uses kenalog  BID prn with improvement.      02/19/2023   10:02 AM 12/22/2022   11:03 AM 08/16/2022    2:49 PM  Depression screen PHQ 2/9  Decreased Interest 0 0 0  Down, Depressed, Hopeless 0 0 0  PHQ - 2 Score 0 0 0  Altered sleeping  0 0  Tired, decreased energy  0 0  Change in appetite  0 0  Feeling bad or failure about yourself   0 0  Trouble concentrating  0 0  Moving slowly or fidgety/restless  0 0  Suicidal thoughts  0 0  PHQ-9 Score  0 0  Difficult doing work/chores  Not difficult at all       12/22/2022   11:03 AM 08/16/2022    2:50 PM 03/30/2022    9:43 AM 02/23/2022    4:25 PM  GAD 7 : Generalized Anxiety Score  Nervous, Anxious, on Edge 0 0 0 0  Control/stop worrying 0 0 0 0  Worry too much - different things 0 0 0 0  Trouble relaxing 0 0 0 0  Restless 0 0 0 0  Easily annoyed or irritable 0 0 0 0  Afraid - awful might happen 0 0 0 0  Total GAD 7 Score 0 0 0 0  Anxiety Difficulty Not difficult at all  Not difficult at all Not difficult at all     Past Medical History:  Diagnosis Date   Chronic kidney disease    Depression    Hyperlipidemia    Hypertension       ROS As per HPI.    Objective:     BP  135/86   Pulse 96   Temp (!) 96.7 F (35.9 C)   Ht 5\' 3"  (1.6 m)   Wt 121 lb 9.6 oz (55.2 kg)   SpO2 100%   BMI 21.54 kg/m    Physical Exam Vitals and nursing note reviewed.  Constitutional:      General: She is not in acute distress.    Appearance: Normal appearance. She is not ill-appearing, toxic-appearing or diaphoretic.  Cardiovascular:     Rate and Rhythm: Normal rate and regular rhythm.     Heart sounds: Normal heart sounds. No murmur heard. Pulmonary:     Effort: Pulmonary effort is normal. No respiratory distress.     Breath sounds: Normal breath sounds.  Musculoskeletal:     Right lower leg: No edema.     Left lower leg: No edema.  Skin:    General: Skin is warm and dry.  Neurological:     General: No focal deficit present.     Mental Status:  She is alert and oriented to person, place, and time.  Psychiatric:        Mood and Affect: Mood normal.        Behavior: Behavior normal.      No results found for any visits on 02/19/23.    The ASCVD Risk score (Arnett DK, et al., 2019) failed to calculate for the following reasons:   The valid HDL cholesterol range is 20 to 100 mg/dL    Assessment & Plan:   Brandy Walker was seen today for medical management of chronic issues.  Diagnoses and all orders for this visit:  Primary hypertension Well controlled on current regimen.   Mixed hyperlipidemia Continue statin. Last LDL 70.  Stage 3b chronic kidney disease (HCC) Managed by nephrology. Reviewed labs from 12/01/22.  Vitamin D  deficiency On supplement.   Depression, major, single episode, complete remission (HCC) Well controlled on current regimen.   Flexural eczema -     triamcinolone  ointment (KENALOG ) 0.5 %; Apply 1 Application topically 2 (two) times daily.    Return in about 6 months (around 08/19/2023) for chronic follow up.  The patient indicates understanding of these issues and agrees with the plan.    Albertha Huger, FNP

## 2023-02-20 ENCOUNTER — Encounter: Payer: Self-pay | Admitting: *Deleted

## 2023-03-29 ENCOUNTER — Other Ambulatory Visit: Payer: Self-pay | Admitting: Family Medicine

## 2023-03-29 DIAGNOSIS — E782 Mixed hyperlipidemia: Secondary | ICD-10-CM

## 2023-05-04 ENCOUNTER — Other Ambulatory Visit

## 2023-05-04 DIAGNOSIS — N189 Chronic kidney disease, unspecified: Secondary | ICD-10-CM | POA: Diagnosis not present

## 2023-05-30 ENCOUNTER — Other Ambulatory Visit: Payer: Self-pay | Admitting: Family Medicine

## 2023-05-30 DIAGNOSIS — I10 Essential (primary) hypertension: Secondary | ICD-10-CM

## 2023-06-22 DIAGNOSIS — N1832 Chronic kidney disease, stage 3b: Secondary | ICD-10-CM | POA: Diagnosis not present

## 2023-06-22 DIAGNOSIS — R809 Proteinuria, unspecified: Secondary | ICD-10-CM | POA: Diagnosis not present

## 2023-06-22 DIAGNOSIS — E875 Hyperkalemia: Secondary | ICD-10-CM | POA: Diagnosis not present

## 2023-06-22 DIAGNOSIS — I129 Hypertensive chronic kidney disease with stage 1 through stage 4 chronic kidney disease, or unspecified chronic kidney disease: Secondary | ICD-10-CM | POA: Diagnosis not present

## 2023-07-13 ENCOUNTER — Other Ambulatory Visit: Payer: Self-pay | Admitting: Family Medicine

## 2023-07-13 DIAGNOSIS — F325 Major depressive disorder, single episode, in full remission: Secondary | ICD-10-CM

## 2023-07-16 ENCOUNTER — Other Ambulatory Visit

## 2023-07-16 DIAGNOSIS — N189 Chronic kidney disease, unspecified: Secondary | ICD-10-CM | POA: Diagnosis not present

## 2023-07-19 DIAGNOSIS — I129 Hypertensive chronic kidney disease with stage 1 through stage 4 chronic kidney disease, or unspecified chronic kidney disease: Secondary | ICD-10-CM | POA: Diagnosis not present

## 2023-07-19 DIAGNOSIS — N1832 Chronic kidney disease, stage 3b: Secondary | ICD-10-CM | POA: Diagnosis not present

## 2023-07-19 DIAGNOSIS — E875 Hyperkalemia: Secondary | ICD-10-CM | POA: Diagnosis not present

## 2023-08-13 NOTE — Progress Notes (Signed)
 Pharmacy Quality Measure Review  This patient is appearing on a report for being at risk of failing the adherence measure for hypertension (ACEi/ARB) medications this calendar year.   Medication: olmesartan 5 mg daily Last fill date: 07/07/22 for 90 day supply  Insurance report was not up to date. No action needed at this time.   Annalena Piatt  [place], [facility]

## 2023-08-15 DIAGNOSIS — R1310 Dysphagia, unspecified: Secondary | ICD-10-CM | POA: Diagnosis not present

## 2023-08-15 DIAGNOSIS — R112 Nausea with vomiting, unspecified: Secondary | ICD-10-CM | POA: Diagnosis not present

## 2023-08-15 DIAGNOSIS — R634 Abnormal weight loss: Secondary | ICD-10-CM | POA: Diagnosis not present

## 2023-09-06 DIAGNOSIS — K222 Esophageal obstruction: Secondary | ICD-10-CM | POA: Diagnosis not present

## 2023-09-06 DIAGNOSIS — R1310 Dysphagia, unspecified: Secondary | ICD-10-CM | POA: Diagnosis not present

## 2023-09-29 DIAGNOSIS — H04123 Dry eye syndrome of bilateral lacrimal glands: Secondary | ICD-10-CM | POA: Diagnosis not present

## 2023-09-29 DIAGNOSIS — H40033 Anatomical narrow angle, bilateral: Secondary | ICD-10-CM | POA: Diagnosis not present

## 2023-10-06 ENCOUNTER — Other Ambulatory Visit: Payer: Self-pay | Admitting: Family Medicine

## 2023-10-06 DIAGNOSIS — E782 Mixed hyperlipidemia: Secondary | ICD-10-CM

## 2023-10-13 ENCOUNTER — Other Ambulatory Visit: Payer: Self-pay | Admitting: Family Medicine

## 2023-10-13 DIAGNOSIS — I10 Essential (primary) hypertension: Secondary | ICD-10-CM

## 2023-10-13 DIAGNOSIS — F325 Major depressive disorder, single episode, in full remission: Secondary | ICD-10-CM

## 2023-10-17 DIAGNOSIS — R112 Nausea with vomiting, unspecified: Secondary | ICD-10-CM | POA: Diagnosis not present

## 2023-10-17 DIAGNOSIS — K222 Esophageal obstruction: Secondary | ICD-10-CM | POA: Diagnosis not present

## 2023-11-01 ENCOUNTER — Other Ambulatory Visit

## 2023-11-02 ENCOUNTER — Other Ambulatory Visit

## 2023-11-03 ENCOUNTER — Ambulatory Visit

## 2023-11-03 ENCOUNTER — Encounter: Payer: Self-pay | Admitting: Emergency Medicine

## 2023-11-03 ENCOUNTER — Ambulatory Visit
Admission: EM | Admit: 2023-11-03 | Discharge: 2023-11-03 | Disposition: A | Discharge status: Home / Self Care | Attending: Nurse Practitioner | Admitting: Nurse Practitioner

## 2023-11-03 DIAGNOSIS — M79672 Pain in left foot: Secondary | ICD-10-CM

## 2023-11-03 DIAGNOSIS — S99922A Unspecified injury of left foot, initial encounter: Secondary | ICD-10-CM | POA: Diagnosis not present

## 2023-11-03 DIAGNOSIS — S9032XA Contusion of left foot, initial encounter: Secondary | ICD-10-CM | POA: Diagnosis not present

## 2023-11-03 DIAGNOSIS — M7732 Calcaneal spur, left foot: Secondary | ICD-10-CM | POA: Diagnosis not present
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
## 2023-11-03 NOTE — Discharge Instructions (Addendum)
 The x-ray was negative for fracture or dislocation. An Ace wrap and postop shoe along with crutches have been provided.  Wear the Ace wrap to provide compression and support.  Recommend using the postop shoe when you are attempting to ambulate to provide stabilization of the foot.  You can use crutches as needed. You may take over-the-counter Tylenol  as needed for pain, fever, or general discomfort. RICE therapy, rest, ice, compression, and elevation.  Apply ice for 20 minutes, remove for 1 hour, repeat as needed. As discussed, recommend follow-up with orthopedics if symptoms fail to improve. Follow-up as needed.

## 2023-11-06 ENCOUNTER — Ambulatory Visit: Admitting: Orthopedic Surgery

## 2023-11-06 ENCOUNTER — Encounter: Payer: Self-pay | Admitting: Orthopedic Surgery

## 2023-11-06 VITALS — BP 130/81 | Ht 63.0 in | Wt 121.0 lb

## 2023-11-06 DIAGNOSIS — M79672 Pain in left foot: Secondary | ICD-10-CM

## 2023-11-06 DIAGNOSIS — R112 Nausea with vomiting, unspecified: Secondary | ICD-10-CM | POA: Insufficient documentation

## 2023-11-06 DIAGNOSIS — K222 Esophageal obstruction: Secondary | ICD-10-CM | POA: Insufficient documentation

## 2023-11-06 DIAGNOSIS — R630 Anorexia: Secondary | ICD-10-CM | POA: Insufficient documentation
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
## 2023-11-11 ENCOUNTER — Other Ambulatory Visit: Payer: Self-pay | Admitting: *Deleted

## 2023-11-11 DIAGNOSIS — E782 Mixed hyperlipidemia: Secondary | ICD-10-CM

## 2023-11-11 DIAGNOSIS — I10 Essential (primary) hypertension: Secondary | ICD-10-CM

## 2023-11-11 DIAGNOSIS — F325 Major depressive disorder, single episode, in full remission: Secondary | ICD-10-CM

## 2023-11-12 MED ORDER — ROSUVASTATIN CALCIUM 5 MG PO TABS: 5.0000 mg | ORAL_TABLET | Freq: Every day | ORAL | 0 refills | Status: AC

## 2023-11-12 MED ORDER — VENLAFAXINE HCL ER 150 MG PO CP24
ORAL_CAPSULE | ORAL | 0 refills | Status: DC
Start: 2023-11-12 — End: 2023-11-16

## 2023-11-12 MED ORDER — AMLODIPINE BESYLATE 10 MG PO TABS: 5.0000 mg | ORAL_TABLET | Freq: Every day | ORAL | 0 refills | Status: DC

## 2023-11-12 NOTE — Telephone Encounter (Signed)
 Apt 11/12/2023

## 2023-11-12 NOTE — Addendum Note (Signed)
 Addended by: Marcheta Horsey D on: 11/12/2023 02:53 PM   Modules accepted: Orders

## 2023-11-12 NOTE — Telephone Encounter (Signed)
 Tiffany pt NTBS 30-d given 10/15/23

## 2023-11-12 NOTE — Telephone Encounter (Signed)
 Apt schedule for 11/12/2023

## 2023-11-14 ENCOUNTER — Ambulatory Visit: Admitting: Family Medicine

## 2023-11-16 ENCOUNTER — Encounter: Payer: Self-pay | Admitting: Family Medicine

## 2023-11-16 ENCOUNTER — Ambulatory Visit: Admitting: Family Medicine

## 2023-11-16 VITALS — BP 105/69 | HR 97 | Temp 97.7°F | Ht 63.0 in | Wt 114.0 lb

## 2023-11-16 DIAGNOSIS — Z23 Encounter for immunization: Secondary | ICD-10-CM | POA: Diagnosis not present

## 2023-11-16 DIAGNOSIS — F1021 Alcohol dependence, in remission: Secondary | ICD-10-CM

## 2023-11-16 DIAGNOSIS — F325 Major depressive disorder, single episode, in full remission: Secondary | ICD-10-CM | POA: Diagnosis not present

## 2023-11-16 DIAGNOSIS — L2082 Flexural eczema: Secondary | ICD-10-CM

## 2023-11-16 DIAGNOSIS — I10 Essential (primary) hypertension: Secondary | ICD-10-CM

## 2023-11-16 DIAGNOSIS — E559 Vitamin D deficiency, unspecified: Secondary | ICD-10-CM

## 2023-11-16 DIAGNOSIS — N1832 Chronic kidney disease, stage 3b: Secondary | ICD-10-CM

## 2023-11-16 DIAGNOSIS — E782 Mixed hyperlipidemia: Secondary | ICD-10-CM | POA: Diagnosis not present

## 2023-11-16 LAB — LIPID PANEL

## 2023-11-16 MED ORDER — VENLAFAXINE HCL ER 150 MG PO CP24: ORAL_CAPSULE | ORAL | 3 refills | Status: AC

## 2023-11-16 MED ORDER — AMLODIPINE BESYLATE 10 MG PO TABS: 10.0000 mg | ORAL_TABLET | Freq: Every day | ORAL | 3 refills | Status: AC

## 2023-11-16 MED ORDER — VENLAFAXINE HCL ER 75 MG PO CP24
75.0000 mg | ORAL_CAPSULE | Freq: Every day | ORAL | 3 refills | Status: AC
Start: 2023-11-16 — End: ?

## 2023-11-16 MED ORDER — TRIAMCINOLONE ACETONIDE 0.5 % EX OINT: 1.0000 | TOPICAL_OINTMENT | Freq: Two times a day (BID) | CUTANEOUS | 2 refills | Status: AC

## 2023-11-16 NOTE — Progress Notes (Signed)
 Established Patient Office Visit  Subjective   Patient ID: Brandy Walker, female    DOB: November 02, 1955  Age: 68 y.o. MRN: [id number]  Chief Complaint  Patient presents with   Medical Management of Chronic Issues    HPI  Discussed the use of AI scribe software for clinical note transcription with the patient, who gave verbal consent to proceed.  History of Present Illness   Brandy Walker is a 68 year old female who presents with morning vomiting and concerns about weight loss.  Morning emesis - Vomiting occurs primarily in the morning when the stomach is empty - Emesis described as small amounts, clear attributed to sinus drainage - No vomiting throughout the remainder of the day - Has not taken prescribed medication by GI for emesis   Unintentional weight loss - Weight loss prompted family concern and medical evaluation - Recent endoscopy performed due to vomiting and weight loss - No current dysphagia - Per GI, weight loss and vomiting suspected to be due to grief  Cannabis use and associated symptoms - Resumed marijuana use following son's death two years ago - Does not smoke continuously throughout the day  Antihypertensive medication adjustment - Currently taking amlodipine  10 mg daily - Olmesartan dosage adjusted to every other day due to concerns about elevated potassium levels  Other medications and dermatologic symptoms - Takes daily vitamin D  supplement - Uses topical cream for eczema as needed  Absence of cardiopulmonary and neurologic symptoms - No chest pain - No shortness of breath - No changes in vision - No dizziness - No swelling in feet     Depression/anxiety - Reports well controlled with effexor      11/16/2023    2:05 PM 02/19/2023   10:02 AM 12/22/2022   11:03 AM  Depression screen PHQ 2/9  Decreased Interest 0 0 0  Down, Depressed, Hopeless 0 0 0  PHQ - 2 Score 0 0 0  Altered sleeping 0  0  Tired, decreased energy 0  0  Change in appetite  0  0  Feeling bad or failure about yourself  0  0  Trouble concentrating 0  0  Moving slowly or fidgety/restless 0  0  Suicidal thoughts 0  0  PHQ-9 Score 0  0   Difficult doing work/chores Not difficult at all  Not difficult at all     Data saved with a previous flowsheet row definition      11/16/2023    2:05 PM 12/22/2022   11:03 AM 08/16/2022    2:50 PM 03/30/2022    9:43 AM  GAD 7 : Generalized Anxiety Score  Nervous, Anxious, on Edge 0 0 0 0  Control/stop worrying 0 0 0 0  Worry too much - different things 0 0 0 0  Trouble relaxing 0 0 0 0  Restless 0 0 0 0  Easily annoyed or irritable 0 0 0 0  Afraid - awful might happen 0 0 0 0  Total GAD 7 Score 0 0 0 0  Anxiety Difficulty Not difficult at all Not difficult at all  Not difficult at all     Past Medical History:  Diagnosis Date   Chronic kidney disease    Depression    Hyperlipidemia    Hypertension       ROS As per HPI.    Objective:     BP 105/69   Pulse 97   Temp 97.7 F (36.5 C) (Temporal)   Ht 5' 3 (  1.6 m)   Wt 114 lb (51.7 kg)   SpO2 98%   BMI 20.19 kg/m  Wt Readings from Last 3 Encounters:  11/16/23 114 lb (51.7 kg)  11/06/23 121 lb (54.9 kg)  02/19/23 121 lb 9.6 oz (55.2 kg)     Physical Exam Vitals and nursing note reviewed.  Constitutional:      General: She is not in acute distress.    Appearance: Normal appearance. She is not ill-appearing, toxic-appearing or diaphoretic.  Cardiovascular:     Rate and Rhythm: Normal rate and regular rhythm.     Heart sounds: Normal heart sounds. No murmur heard. Pulmonary:     Effort: Pulmonary effort is normal. No respiratory distress.     Breath sounds: Normal breath sounds.  Musculoskeletal:     Right lower leg: No edema.     Left lower leg: No edema.  Skin:    General: Skin is warm and dry.  Neurological:     General: No focal deficit present.     Mental Status: She is alert and oriented to person, place, and time.  Psychiatric:         Mood and Affect: Mood normal.        Behavior: Behavior normal.      No results found for any visits on 11/16/23.    The ASCVD Risk score (Arnett DK, et al., 2019) failed to calculate for the following reasons:   The valid HDL cholesterol range is 20 to 100 mg/dL    Assessment & Plan:   Brandy Walker was seen today for medical management of chronic issues.  Diagnoses and all orders for this visit:  Primary hypertension -     CBC with Differential/Platelet -     CMP14+EGFR -     TSH -     amLODipine  (NORVASC ) 10 MG tablet; Take 1 tablet (10 mg total) by mouth daily.  Mixed hyperlipidemia -     Lipid panel  Depression, major, single episode, complete remission -     venlafaxine  XR (EFFEXOR -XR) 75 MG 24 hr capsule; Take 1 capsule (75 mg total) by mouth daily with breakfast. -     venlafaxine  XR (EFFEXOR -XR) 150 MG 24 hr capsule; Take one 75 mg capsule in addition to one 150 mg capsule daily.  Stage 3b chronic kidney disease (HCC) -     CBC with Differential/Platelet -     CMP14+EGFR -     VITAMIN D  25 Hydroxy (Vit-D Deficiency, Fractures)  Vitamin D  deficiency -     VITAMIN D  25 Hydroxy (Vit-D Deficiency, Fractures)  Flexural eczema -     triamcinolone  ointment (KENALOG ) 0.5 %; Apply 1 Application topically 2 (two) times daily.  Recovering alcoholic in remission (HCC)  Encounter for immunization -     Flu vaccine HIGH DOSE PF(Fluzone Trivalent)  Assessment and Plan    Primary hypertension Blood pressure controlled on current regimen. Potassium levels adjusted to prevent hyperkalemia. - Continue amlodipine  10 mg daily. - Monitor potassium levels.  Chronic kidney disease, stage 3b Renal function to be assessed with upcoming lab work. - Ordered lab work to assess renal function.  Mixed hyperlipidemia Cholesterol levels to be checked despite non-fasting state. - Ordered cholesterol panel.  Vitamin D  deficiency Continues daily vitamin D  supplementation. - Continue  daily vitamin D  supplementation.  Flexural eczema Eczema managed with topical cream. Symptoms recur if cream is not used. - Prescribed topical cream with refills.  Depression in remission Depression remains in remission with current  treatment regimen. - Continue current treatment regimen.     Eczema - Continue kenalog  BID.   Return in about 6 months (around 05/15/2024) for chronic follow up.  The patient indicates understanding of these issues and agrees with the plan.    Brandy CHRISTELLA Search, FNP

## 2023-11-17 LAB — CBC WITH DIFFERENTIAL/PLATELET
Basophils Absolute: 0.1 x10E3/uL (ref 0.0–0.2)
Basos: 1 %
EOS (ABSOLUTE): 0.2 x10E3/uL (ref 0.0–0.4)
Eos: 2 %
Hematocrit: 33.8 % — ABNORMAL LOW (ref 34.0–46.6)
Hemoglobin: 11.2 g/dL (ref 11.1–15.9)
Immature Grans (Abs): 0 x10E3/uL (ref 0.0–0.1)
Immature Granulocytes: 0 %
Lymphocytes Absolute: 2.6 x10E3/uL (ref 0.7–3.1)
Lymphs: 26 %
MCH: 32.5 pg (ref 26.6–33.0)
MCHC: 33.1 g/dL (ref 31.5–35.7)
MCV: 98 fL — ABNORMAL HIGH (ref 79–97)
Monocytes Absolute: 1.1 x10E3/uL — ABNORMAL HIGH (ref 0.1–0.9)
Monocytes: 11 %
Neutrophils Absolute: 6 x10E3/uL (ref 1.4–7.0)
Neutrophils: 60 %
Platelets: 357 x10E3/uL (ref 150–450)
RBC: 3.45 x10E6/uL — ABNORMAL LOW (ref 3.77–5.28)
RDW: 12.6 % (ref 11.7–15.4)
WBC: 10 x10E3/uL (ref 3.4–10.8)

## 2023-11-17 LAB — LIPID PANEL
Chol/HDL Ratio: 1.9 ratio (ref 0.0–4.4)
Cholesterol, Total: 209 mg/dL — ABNORMAL HIGH (ref 100–199)
HDL: 111 mg/dL (ref >39)
LDL Chol Calc (NIH): 79 mg/dL (ref 0–99)
Triglycerides: 112 mg/dL (ref 0–149)
VLDL Cholesterol Cal: 19 mg/dL (ref 5–40)

## 2023-11-17 LAB — CMP14+EGFR
ALT: 11 IU/L (ref 0–32)
AST: 18 IU/L (ref 0–40)
Albumin: 4.4 g/dL (ref 3.9–4.9)
Alkaline Phosphatase: 82 IU/L (ref 49–135)
BUN/Creatinine Ratio: 19 (ref 12–28)
BUN: 34 mg/dL — ABNORMAL HIGH (ref 8–27)
Bilirubin Total: 0.6 mg/dL (ref 0.0–1.2)
CO2: 22 mmol/L (ref 20–29)
Calcium: 9.7 mg/dL (ref 8.7–10.3)
Chloride: 98 mmol/L (ref 96–106)
Creatinine, Ser: 1.8 mg/dL — ABNORMAL HIGH (ref 0.57–1.00)
Globulin, Total: 2.2 g/dL (ref 1.5–4.5)
Glucose: 87 mg/dL (ref 70–99)
Potassium: 5.5 mmol/L — ABNORMAL HIGH (ref 3.5–5.2)
Sodium: 136 mmol/L (ref 134–144)
Total Protein: 6.6 g/dL (ref 6.0–8.5)
eGFR: 31 mL/min/1.73 — ABNORMAL LOW (ref >59)

## 2023-11-17 LAB — TSH: TSH: 2.4 u[IU]/mL (ref 0.450–4.500)

## 2023-11-17 LAB — VITAMIN D 25 HYDROXY (VIT D DEFICIENCY, FRACTURES): Vit D, 25-Hydroxy: 40.9 ng/mL (ref 30.0–100.0)

## 2023-11-19 ENCOUNTER — Ambulatory Visit: Payer: Self-pay | Admitting: Family Medicine

## 2023-11-19 DIAGNOSIS — N1832 Chronic kidney disease, stage 3b: Secondary | ICD-10-CM

## 2023-11-20 ENCOUNTER — Telehealth: Payer: Self-pay | Admitting: Family Medicine

## 2023-11-20 NOTE — Telephone Encounter (Signed)
 Already discussed with pt in separate encounter. LS

## 2023-11-20 NOTE — Telephone Encounter (Unsigned)
 Copied from CRM [id number]. Topic: General - Call Back - No Documentation >> Nov 19, 2023  5:05 PM Kevelyn M wrote: Reason for CRM: Patient returning phone call.  Call back: [phone]

## 2023-11-21 ENCOUNTER — Ambulatory Visit: Admitting: Orthopedic Surgery

## 2023-11-27 ENCOUNTER — Other Ambulatory Visit

## 2023-11-27 DIAGNOSIS — N1832 Chronic kidney disease, stage 3b: Secondary | ICD-10-CM

## 2023-11-27 LAB — BMP8+EGFR
BUN/Creatinine Ratio: 16 (ref 12–28)
BUN: 27 mg/dL (ref 8–27)
CO2: 23 mmol/L (ref 20–29)
Calcium: 9.6 mg/dL (ref 8.7–10.3)
Chloride: 104 mmol/L (ref 96–106)
Creatinine, Ser: 1.66 mg/dL — ABNORMAL HIGH (ref 0.57–1.00)
Glucose: 81 mg/dL (ref 70–99)
Potassium: 4.5 mmol/L (ref 3.5–5.2)
Sodium: 142 mmol/L (ref 134–144)
eGFR: 34 mL/min/1.73 — ABNORMAL LOW (ref >59)

## 2023-11-28 ENCOUNTER — Ambulatory Visit: Payer: Self-pay | Admitting: Family Medicine
